# Patient Record
Sex: Female | Born: 2000 | Race: Black or African American | Hispanic: No | Marital: Single | Smoking: Never smoker
Health system: Southern US, Community
[De-identification: ages and names within clinical notes are randomized; demographics above are authoritative.]

## PROBLEM LIST (undated history)

## (undated) DIAGNOSIS — J45909 Unspecified asthma, uncomplicated: Secondary | ICD-10-CM

## (undated) HISTORY — PX: FINGER SURGERY: SHX640

## (undated) HISTORY — PX: NO PAST SURGERIES: SHX2092

---
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
## 2018-01-08 ENCOUNTER — Other Ambulatory Visit: Payer: Self-pay

## 2018-01-08 ENCOUNTER — Encounter (HOSPITAL_COMMUNITY): Payer: Self-pay | Admitting: Emergency Medicine

## 2018-01-08 ENCOUNTER — Emergency Department (HOSPITAL_COMMUNITY)
Admission: EM | Admit: 2018-01-08 | Discharge: 2018-01-08 | Disposition: A | Discharge status: Home / Self Care | Payer: Medicaid Other | Attending: Emergency Medicine | Admitting: Emergency Medicine

## 2018-01-08 ENCOUNTER — Emergency Department (HOSPITAL_COMMUNITY): Payer: Medicaid Other

## 2018-01-08 DIAGNOSIS — R079 Chest pain, unspecified: Secondary | ICD-10-CM

## 2018-01-08 DIAGNOSIS — R0789 Other chest pain: Secondary | ICD-10-CM | POA: Diagnosis not present

## 2018-01-08 LAB — CBC
HEMATOCRIT: 38.9 % (ref 36.0–46.0)
HEMOGLOBIN: 12 g/dL (ref 12.0–15.0)
MCH: 28 pg (ref 26.0–34.0)
MCHC: 30.8 g/dL (ref 30.0–36.0)
MCV: 90.9 fL (ref 80.0–100.0)
Platelets: 189 10*3/uL (ref 150–400)
RBC: 4.28 MIL/uL (ref 3.87–5.11)
RDW: 11 % — ABNORMAL LOW (ref 11.5–15.5)
WBC: 8 10*3/uL (ref 4.0–10.5)
nRBC: 0 % (ref 0.0–0.2)

## 2018-01-08 LAB — I-STAT BETA HCG BLOOD, ED (MC, WL, AP ONLY): I-stat hCG, quantitative: <5 m[IU]/mL (ref <5)

## 2018-01-08 LAB — BASIC METABOLIC PANEL
Anion gap: 7 (ref 5–15)
BUN: 15 mg/dL (ref 6–20)
CHLORIDE: 110 mmol/L (ref 98–111)
CO2: 21 mmol/L — ABNORMAL LOW (ref 22–32)
Calcium: 9 mg/dL (ref 8.9–10.3)
Creatinine, Ser: 0.7 mg/dL (ref 0.44–1.00)
GFR calc Af Amer: >60 mL/min (ref >60)
GFR calc non Af Amer: >60 mL/min (ref >60)
GLUCOSE: 113 mg/dL — AB (ref 70–99)
POTASSIUM: 3.3 mmol/L — AB (ref 3.5–5.1)
SODIUM: 138 mmol/L (ref 135–145)

## 2018-01-08 LAB — I-STAT TROPONIN, ED: Troponin i, poc: 0 ng/mL (ref 0.00–0.08)

## 2018-01-08 LAB — D-DIMER, QUANTITATIVE (NOT AT ARMC)

## 2018-01-08 LAB — I-STAT CG4 LACTIC ACID, ED: LACTIC ACID, VENOUS: 1.3 mmol/L (ref 0.5–1.9)

## 2018-01-08 NOTE — ED Notes (Signed)
PT states understanding of care given, follow up care. PT ambulated from ED to car with a steady gait.  

## 2018-01-08 NOTE — ED Notes (Signed)
Patient transported to X-ray 

## 2018-01-08 NOTE — ED Triage Notes (Signed)
Pt c/o left sided chest pain and right arm numbness while lying in the bed tonight at 2200. Numbness has resolved. Denies shortness of breath/nausea/vomiting.

## 2018-01-08 NOTE — ED Provider Notes (Signed)
[facility] EMERGENCY DEPARTMENT Provider Note   CSN: [id number] Arrival date & time: 01/08/18  0114     History   Chief Complaint Chief Complaint  Patient presents with  . Chest Pain    HPI Michaela Gibson is a 17 y.o. female.  Patient presents to the emergency department with a chief complaint of left-sided chest pain.  She states that she has had the pain all day.  She states that she has had some associated shortness of breath, and reportedly stopped breathing for 1 minute, and then spontaneously began breathing again.  She did not lose consciousness.  She complains of having some numbness in her left arm.  She states that she is under a lot of stress.  She denies any heart or lung problems.  Denies any history of PE or DVT.  Denies any recent surgery or immobilization.  She does take birth control.  She denies any leg or calf pain.  Denies any radiating pain to her back.  Denies any other associated symptoms.  She does not smoke or use any recreational drugs.  The history is provided by the patient. No language interpreter was used.    History reviewed. No pertinent past medical history.  There are no active problems to display for this patient.   History reviewed. No pertinent surgical history.   OB History   None      Home Medications    Prior to Admission medications   Not on File    Family History No family history on file.  Social History Social History   Tobacco Use  . Smoking status: Never Smoker  . Smokeless tobacco: Never Used  Substance Use Topics  . Alcohol use: No  . Drug use: Not on file     Allergies   Patient has no known allergies.   Review of Systems Review of Systems  All other systems reviewed and are negative.    Physical Exam Updated Vital Signs BP 124/77   Pulse 66   Temp 99.1 F (37.3 C) (Oral)   Resp 12   SpO2 100%   Physical Exam  Constitutional: She is oriented to person, place, and time. She  appears well-developed and well-nourished.  HENT:  Head: Normocephalic and atraumatic.  Eyes: Pupils are equal, round, and reactive to light. Conjunctivae and EOM are normal.  Neck: Normal range of motion. Neck supple.  Cardiovascular: Normal rate and regular rhythm. Exam reveals no gallop and no friction rub.  No murmur heard. Pulmonary/Chest: Effort normal and breath sounds normal. No respiratory distress. She has no wheezes. She has no rales. She exhibits no tenderness.  Abdominal: Soft. Bowel sounds are normal. She exhibits no distension and no mass. There is no tenderness. There is no rebound and no guarding.  Musculoskeletal: Normal range of motion. She exhibits no edema or tenderness.  Neurological: She is alert and oriented to person, place, and time.  Skin: Skin is warm and dry.  Psychiatric: She has a normal mood and affect. Her behavior is normal. Judgment and thought content normal.  Nursing note and vitals reviewed.    ED Treatments / Results  Labs (all labs ordered are listed, but only abnormal results are displayed) Labs Reviewed  BASIC METABOLIC PANEL - Abnormal; Notable for the following components:      Result Value   Potassium 3.3 (*)    CO2 21 (*)    Glucose, Bld 113 (*)    All other components within normal limits  CBC - Abnormal; Notable for the following components:   RDW 11.0 (*)    All other components within normal limits  D-DIMER, QUANTITATIVE (NOT AT [facility])  I-STAT TROPONIN, ED  I-STAT BETA HCG BLOOD, ED ([facility], [facility], [facility] ONLY)  I-STAT CG4 LACTIC ACID, ED    EKG EKG Interpretation  Date/Time:  Wednesday January 08 2018 01:17:52 EDT Ventricular Rate:  77 PR Interval:  116 QRS Duration: 74 QT Interval:  352 QTC Calculation: 398 R Axis:   62 Text Interpretation:  Normal sinus rhythm with sinus arrhythmia Normal ECG No previous ECGs available Confirmed by Glynn Octave [phone]) on 01/08/2018 3:13:47 AM   Radiology Dg Chest 2 View  Result Date:  01/08/2018 CLINICAL DATA:  Left side chest pain, dizziness EXAM: CHEST - 2 VIEW COMPARISON:  None. FINDINGS: The heart size and mediastinal contours are within normal limits. Both lungs are clear. The visualized skeletal structures are unremarkable. IMPRESSION: No active cardiopulmonary disease. Electronically Signed   By: Charlett Nose M.D.   On: 01/08/2018 02:09    Procedures Procedures (including critical care time)  Medications Ordered in ED Medications - No data to display   Initial Impression / Assessment and Plan / ED Course  I have reviewed the triage vital signs and the nursing notes.  Pertinent labs & imaging results that were available during my care of the patient were reviewed by me and considered in my medical decision making (see chart for details).     Patient presents with chest pain since yesterday.   DDx includes ACS, PE, pneumothorax, aortic dissection, esophageal rupture, pericarditis, chest wall pain.  Doubt ACS, normal troponin, no ischemic EKG findings, HEART score is: 0.  Low risk for PE,d-dimer is negative, patient is not tachycardic nor hypoxic.  No evidence of pneumothorax on CXR.  Doubt dissection, no mediastinal widening on CXR, no ripping/tearing chest pain, neurovascularly intact.  Doubt pericarditis, no positional changes, or diffuse ST elevations on EKG.  Pain is not reproducible, doubt MSK.  Suspect anxiety/stress, but will have patient follow-up with pcp to further address.  No additional emergent workup today.    All findings were discussed with patient.  Patient understands and agrees with the plan.     Final Clinical Impressions(s) / ED Diagnoses   Final diagnoses:  Chest pain, unspecified type    ED Discharge Orders    None       Roxy Horseman, PA-C 01/08/18 0320    Glynn Octave, MD 01/08/18 [phone]

## 2018-01-20 ENCOUNTER — Emergency Department
Admission: EM | Admit: 2018-01-20 | Discharge: 2018-01-20 | Disposition: A | Discharge status: Home / Self Care | Payer: Medicaid Other | Source: Home / Self Care | Attending: Emergency Medicine | Admitting: Emergency Medicine

## 2018-01-20 ENCOUNTER — Other Ambulatory Visit: Payer: Self-pay

## 2018-01-20 ENCOUNTER — Emergency Department
Admission: EM | Admit: 2018-01-20 | Discharge: 2018-01-20 | Disposition: A | Discharge status: Home / Self Care | Payer: Medicaid Other | Attending: Emergency Medicine | Admitting: Emergency Medicine

## 2018-01-20 ENCOUNTER — Encounter: Payer: Self-pay | Admitting: Emergency Medicine

## 2018-01-20 DIAGNOSIS — H5789 Other specified disorders of eye and adnexa: Secondary | ICD-10-CM | POA: Diagnosis present

## 2018-01-20 DIAGNOSIS — H1031 Unspecified acute conjunctivitis, right eye: Secondary | ICD-10-CM

## 2018-01-20 DIAGNOSIS — Z5321 Procedure and treatment not carried out due to patient leaving prior to being seen by health care provider: Secondary | ICD-10-CM | POA: Insufficient documentation

## 2018-01-20 MED ORDER — TOBRAMYCIN 0.3 % OP SOLN: 2.0000 [drp] | OPHTHALMIC | 0 refills | Status: DC

## 2018-01-20 NOTE — ED Provider Notes (Signed)
[facility] Emergency Department Provider Note  ____________________________________________   First MD Initiated Contact with Patient 01/20/18 1024     (approximate)  I have reviewed the triage vital signs and the nursing notes.   HISTORY  Chief Complaint Eye Problem    HPI Michaela Gibson is a 17 y.o. female presents emergency department complaining of right eye redness with some matting.  Symptoms started last night.  She denies fever chills.  No change in her vision.  No known injury.  Her younger sister has pinkeye at this time.    History reviewed. No pertinent past medical history.  There are no active problems to display for this patient.   History reviewed. No pertinent surgical history.  Prior to Admission medications   Medication Sig Start Date End Date Taking? Authorizing Provider  medroxyPROGESTERone (DEPO-PROVERA) 150 MG/ML injection Inject 150 mg into the muscle every 3 (three) months.    [provider]  tobramycin (TOBREX) 0.3 % ophthalmic solution Place 2 drops into both eyes every 4 (four) hours. 01/20/18   Faythe Ghee, PA-C    Allergies Patient has no known allergies.  History reviewed. No pertinent family history.  Social History Social History   Tobacco Use  . Smoking status: Never Smoker  . Smokeless tobacco: Never Used  Substance Use Topics  . Alcohol use: No  . Drug use: Not on file    Review of Systems  Constitutional: No fever/chills Eyes: No visual changes.  Positive for eye drainage and redness ENT: No sore throat. Respiratory: Denies cough Genitourinary: Negative for dysuria. Musculoskeletal: Negative for back pain. Skin: Negative for rash.    ____________________________________________   PHYSICAL EXAM:  VITAL SIGNS: ED Triage Vitals  Enc Vitals Group     BP 01/20/18 0918 (!) 133/96     Pulse Rate 01/20/18 0918 90     Resp 01/20/18 0918 18     Temp 01/20/18 0918 98.2 F (36.8 C)       Temp Source 01/20/18 0918 Oral     SpO2 01/20/18 0918 100 %     Weight 01/20/18 0927 98 lb (44.5 kg)     Height 01/20/18 0927 5\' 2"  (1.575 m)     Head Circumference --      Peak Flow --      Pain Score 01/20/18 0927 4     Pain Loc --      Pain Edu? --      Excl. in GC? --     Constitutional: Alert and oriented. Well appearing and in no acute distress. Eyes: Conjunctiva of the right eye is injected with some exudate Head: Atraumatic. Nose: No congestion/rhinnorhea. Mouth/Throat: Mucous membranes are moist.   Neck:  supple no lymphadenopathy noted Cardiovascular: Normal rate, regular rhythm. Heart sounds are normal Respiratory: Normal respiratory effort.  No retractions, lungs c t a  GU: deferred Musculoskeletal: FROM all extremities, warm and well perfused Neurologic:  Normal speech and language.  Skin:  Skin is warm, dry and intact. No rash noted. Psychiatric: Mood and affect are normal. Speech and behavior are normal.  ____________________________________________   LABS (all labs ordered are listed, but only abnormal results are displayed)  Labs Reviewed - No data to display ____________________________________________   ____________________________________________  RADIOLOGY    ____________________________________________   PROCEDURES  Procedure(s) performed: No  Procedures    ____________________________________________   INITIAL IMPRESSION / ASSESSMENT AND PLAN / ED COURSE  Pertinent labs & imaging results that were available during  my care of the patient were reviewed by me and considered in my medical decision making (see chart for details).   Patient is a 17 year old female presents emergency department complaining of right eye redness and drainage.  No injury.  Family member has pinkeye.  On physical exam the right eye is injected.  Remainder the exam is unremarkable  Explained symptoms to the patient.  She was given prescription for  tobramycin ophthalmic drops.  She is to follow-up with [facility] if no improvement in 2 to 3 days.  Return emergency department worsening.  See her regular doctor as needed.  States she understands will comply.  She is given a work note as she is contagious.  They were given instructions on how to care and decrease pain medication the transmission to other family members.  They state they understand and will comply.  She was discharged stable condition.     As part of my medical decision making, I reviewed the following data within the electronic MEDICAL RECORD NUMBER Nursing notes reviewed and incorporated, Notes from prior ED visits and [place] Controlled Sub[place] Database  ____________________________________________   FINAL CLINICAL IMPRESSION(S) / ED DIAGNOSES  Final diagnoses:  Acute bacterial conjunctivitis of right eye      NEW MEDICATIONS STARTED DURING THIS VISIT:  Discharge Medication List as of 01/20/2018 10:40 AM    START taking these medications   Details  tobramycin (TOBREX) 0.3 % ophthalmic solution Place 2 drops into both eyes every 4 (four) hours., Starting Mon 01/20/2018, Normal         Note:  This document was prepared using Dragon voice recognition software and may include unintentional dictation errors.    Faythe Ghee, PA-C 01/20/18 1048    Sharman Cheek, MD 01/20/18 [phone]

## 2018-01-20 NOTE — Discharge Instructions (Addendum)
Follow-up with your regular doctor if not better in 3 to 5 days.  Return emergency department worsening.  Call the [facility] if no improvement in 2 days.  Use medications as prescribed.  Apply warm compress to the right eye if it is matted shut.  You should not work until you have had 24 hours of eyedrops and there is no longer drainage from the eye.

## 2018-01-20 NOTE — ED Triage Notes (Signed)
Pt arrived via POV with family with reports of redness to right eye that started last night. Pt's daughter is also being seen today for similar eye problem.    Pt reports redness and increased drainage and was matted shut this morning.

## 2018-01-20 NOTE — ED Notes (Signed)
See triage note  Presents with right eye redness and irritation  Also had some drainage

## 2018-01-20 NOTE — ED Triage Notes (Signed)
Reports right eye redness since 10 pm.

## 2018-02-21 ENCOUNTER — Emergency Department: Payer: Self-pay

## 2018-02-21 ENCOUNTER — Emergency Department
Admission: EM | Admit: 2018-02-21 | Discharge: 2018-02-21 | Disposition: A | Discharge status: Home / Self Care | Payer: Self-pay | Attending: Emergency Medicine | Admitting: Emergency Medicine

## 2018-02-21 DIAGNOSIS — Z79899 Other long term (current) drug therapy: Secondary | ICD-10-CM | POA: Insufficient documentation

## 2018-02-21 DIAGNOSIS — M25562 Pain in left knee: Secondary | ICD-10-CM | POA: Insufficient documentation

## 2018-02-21 MED ORDER — NAPROXEN 500 MG PO TABS: 500.0000 mg | ORAL_TABLET | Freq: Two times a day (BID) | ORAL | Status: DC

## 2018-02-21 NOTE — ED Provider Notes (Signed)
[facility] Emergency Department Provider Note   ____________________________________________   First MD Initiated Contact with Patient 02/21/18 [phone]     (approximate)  I have reviewed the triage vital signs and the nursing notes.   HISTORY  Chief Complaint Knee Pain    HPI Michaela Gibson is a 17 y.o. female patient complaint intermittent knee pain for 4 years.  Onset and style was at a track event when she was told she had a meniscus injury.  No orthopedic intervention.  Patient states she was at work last night which required prolonged standing and when she left work she had increased pain with flexion of the knee.  Patient states pain initially was 10/10 has now decreased to a 6/10 status post limited standing and weightbearing.  Patient described the pain is "achy".  No palliative measure for complaint.  History reviewed. No pertinent past medical history.  There are no active problems to display for this patient.   History reviewed. No pertinent surgical history.  Prior to Admission medications   Medication Sig Start Date End Date Taking? Authorizing Provider  medroxyPROGESTERone (DEPO-PROVERA) 150 MG/ML injection Inject 150 mg into the muscle every 3 (three) months.    [provider]  naproxen (NAPROSYN) 500 MG tablet Take 1 tablet (500 mg total) by mouth 2 (two) times daily with a meal. 02/21/18   Joni Reining, PA-C  tobramycin (TOBREX) 0.3 % ophthalmic solution Place 2 drops into both eyes every 4 (four) hours. 01/20/18   Faythe Ghee, PA-C    Allergies Patient has no known allergies.  No family history on file.  Social History Social History   Tobacco Use  . Smoking status: Never Smoker  . Smokeless tobacco: Never Used  Substance Use Topics  . Alcohol use: No  . Drug use: Not on file    Review of Systems Constitutional: No fever/chills Eyes: No visual changes. ENT: No sore throat. Cardiovascular: Denies chest  pain. Respiratory: Denies shortness of breath. Gastrointestinal: No abdominal pain.  No nausea, no vomiting.  No diarrhea.  No constipation. Genitourinary: Negative for dysuria. Musculoskeletal: Positive for left knee pain.. Skin: Negative for rash. Neurological: Negative for headaches, focal weakness or numbness.   ____________________________________________   PHYSICAL EXAM:  VITAL SIGNS: ED Triage Vitals [02/21/18 0721]  Enc Vitals Group     BP      Pulse      Resp      Temp      Temp src      SpO2      Weight      Height      Head Circumference      Peak Flow      Pain Score 6     Pain Loc      Pain Edu?      Excl. in GC?     Constitutional: Alert and oriented. Well appearing and in no acute distress. Cardiovascular: Normal rate, regular rhythm. Grossly normal heart sounds.  Good peripheral circulation. Respiratory: Normal respiratory effort.  No retractions. Lungs CTAB. Musculoskeletal: No lower extremity tenderness nor edema.  No joint effusions. Neurologic:  Normal speech and language. No gross focal neurologic deficits are appreciated. No gait instability. Skin:  Skin is warm, dry and intact. No rash noted. Psychiatric: Mood and affect are normal. Speech and behavior are normal.  ____________________________________________   LABS (all labs ordered are listed, but only abnormal results are displayed)  Labs Reviewed - No data to display  ____________________________________________  EKG   ____________________________________________  RADIOLOGY  ED MD interpretation: Official radiology report(s): Dg Knee Complete 4 Views Left  Result Date: 02/21/2018 CLINICAL DATA:  Increased knee pain with flexion. EXAM: LEFT KNEE - COMPLETE 4+ VIEW COMPARISON:  No prior. FINDINGS: No acute bony or joint abnormality identified. No evidence of fracture or dislocation. IMPRESSION: No acute bony or joint abnormality identified. Electronically Signed   By: Maisie Fushomas   Register   On: 02/21/2018 08:10    ____________________________________________   PROCEDURES  Procedure(s) performed: None  Procedures  Critical Care performed: No  ____________________________________________   INITIAL IMPRESSION / ASSESSMENT AND PLAN / ED COURSE  As part of my medical decision making, I reviewed the following data within the electronic MEDICAL RECORD NUMBER    Chronic right knee pain.  Discussed negative findings with patient.  Patient advised to follow orthopedic for definitive evaluation treatment.      ____________________________________________   FINAL CLINICAL IMPRESSION(S) / ED DIAGNOSES  Final diagnoses:  Left knee pain, unspecified chronicity     ED Discharge Orders         Ordered    naproxen (NAPROSYN) 500 MG tablet  2 times daily with meals     02/21/18 [id number]           Note:  This document was prepared using Dragon voice recognition software and may include unintentional dictation errors.    Joni ReiningSmith, Ninetta Adelstein K, PA-C 02/21/18 Isac [id number]    Jene EveryKinner, Robert, MD 02/23/18 2224

## 2018-02-21 NOTE — ED Triage Notes (Signed)
Pt presents today for knee pain. Pt states she hurt it 4 years ago but the pain has been coming and going. Pt is ambulatory in triage.

## 2018-02-21 NOTE — ED Notes (Signed)
Patient left without prescription or written discharge instructions. Patient did receive her work note. Two unsuccessful attempts to contact via telephone. Will defer to Viviano SimasLiz Gannon RN to follow-up.

## 2018-02-21 NOTE — Discharge Instructions (Addendum)
Advised to wear over-the-counter elastic knee support when working.

## 2018-03-28 ENCOUNTER — Encounter: Payer: Self-pay | Admitting: Emergency Medicine

## 2018-03-28 ENCOUNTER — Emergency Department
Admission: EM | Admit: 2018-03-28 | Discharge: 2018-03-28 | Disposition: A | Discharge status: Home / Self Care | Payer: Medicaid Other | Attending: Emergency Medicine | Admitting: Emergency Medicine

## 2018-03-28 ENCOUNTER — Other Ambulatory Visit: Payer: Self-pay

## 2018-03-28 DIAGNOSIS — J45909 Unspecified asthma, uncomplicated: Secondary | ICD-10-CM | POA: Insufficient documentation

## 2018-03-28 DIAGNOSIS — R8271 Bacteriuria: Secondary | ICD-10-CM | POA: Insufficient documentation

## 2018-03-28 DIAGNOSIS — R1033 Periumbilical pain: Secondary | ICD-10-CM | POA: Insufficient documentation

## 2018-03-28 DIAGNOSIS — R110 Nausea: Secondary | ICD-10-CM

## 2018-03-28 DIAGNOSIS — R112 Nausea with vomiting, unspecified: Secondary | ICD-10-CM | POA: Insufficient documentation

## 2018-03-28 HISTORY — DX: Unspecified asthma, uncomplicated: J45.909

## 2018-03-28 LAB — COMPREHENSIVE METABOLIC PANEL
ALK PHOS: 69 U/L (ref 38–126)
ALT: 13 U/L (ref 0–44)
AST: 22 U/L (ref 15–41)
Albumin: 4.5 g/dL (ref 3.5–5.0)
Anion gap: 8 (ref 5–15)
BUN: 16 mg/dL (ref 6–20)
CALCIUM: 8.9 mg/dL (ref 8.9–10.3)
CO2: 23 mmol/L (ref 22–32)
CREATININE: 0.65 mg/dL (ref 0.44–1.00)
Chloride: 105 mmol/L (ref 98–111)
Glucose, Bld: 98 mg/dL (ref 70–99)
Potassium: 3.3 mmol/L — ABNORMAL LOW (ref 3.5–5.1)
Sodium: 136 mmol/L (ref 135–145)
TOTAL PROTEIN: 7.3 g/dL (ref 6.5–8.1)
Total Bilirubin: 1.5 mg/dL — ABNORMAL HIGH (ref 0.3–1.2)

## 2018-03-28 LAB — CBC
HCT: 37.3 % (ref 36.0–46.0)
Hemoglobin: 12.1 g/dL (ref 12.0–15.0)
MCH: 28.5 pg (ref 26.0–34.0)
MCHC: 32.4 g/dL (ref 30.0–36.0)
MCV: 88 fL (ref 80.0–100.0)
NRBC: 0 % (ref 0.0–0.2)
PLATELETS: 158 10*3/uL (ref 150–400)
RBC: 4.24 MIL/uL (ref 3.87–5.11)
RDW: 11.3 % — AB (ref 11.5–15.5)
WBC: 7.1 10*3/uL (ref 4.0–10.5)

## 2018-03-28 LAB — URINALYSIS, COMPLETE (UACMP) WITH MICROSCOPIC
Bilirubin Urine: NEGATIVE
Glucose, UA: NEGATIVE mg/dL
Hgb urine dipstick: NEGATIVE
Ketones, ur: NEGATIVE mg/dL
Leukocytes, UA: NEGATIVE
Nitrite: NEGATIVE
Protein, ur: NEGATIVE mg/dL
Specific Gravity, Urine: 1.02 (ref 1.005–1.030)
pH: 6 (ref 5.0–8.0)

## 2018-03-28 LAB — POCT PREGNANCY, URINE: Preg Test, Ur: NEGATIVE

## 2018-03-28 LAB — LIPASE, BLOOD: Lipase: 38 U/L (ref 11–51)

## 2018-03-28 MED ORDER — ONDANSETRON HCL 4 MG/2ML IJ SOLN: 4.0000 mg | Freq: Once | INTRAMUSCULAR | Status: DC

## 2018-03-28 MED ORDER — SULFAMETHOXAZOLE-TRIMETHOPRIM 800-160 MG PO TABS: 1.0000 | ORAL_TABLET | Freq: Two times a day (BID) | ORAL | 0 refills | Status: AC

## 2018-03-28 MED ORDER — ONDANSETRON 4 MG PO TBDP
4.0000 mg | ORAL_TABLET | Freq: Once | ORAL | Status: AC
  Administered 2018-03-28: 4 mg via ORAL

## 2018-03-28 MED ORDER — ONDANSETRON 4 MG PO TBDP
ORAL_TABLET | ORAL | Status: AC
  Administered 2018-03-28: 4 mg via ORAL
  Filled 2018-03-28: qty 1

## 2018-03-28 MED ORDER — ONDANSETRON 4 MG PO TBDP: 4.0000 mg | ORAL_TABLET | Freq: Three times a day (TID) | ORAL | 0 refills | Status: DC | PRN

## 2018-03-28 MED ORDER — ACETAMINOPHEN 325 MG PO TABS
650.0000 mg | ORAL_TABLET | Freq: Once | ORAL | Status: AC
  Administered 2018-03-28: 650 mg via ORAL
  Filled 2018-03-28 (×2): qty 2

## 2018-03-28 MED ORDER — SULFAMETHOXAZOLE-TRIMETHOPRIM 800-160 MG PO TABS: 1.0000 | ORAL_TABLET | Freq: Two times a day (BID) | ORAL | 0 refills | Status: DC

## 2018-03-28 NOTE — ED Provider Notes (Signed)
[facility] Emergency Department Provider Note  ____________________________________________  Time seen: Approximately 7:51 PM  I have reviewed the triage vital signs and the nursing notes.   HISTORY  Chief Complaint Abdominal Pain    HPI Michaela Gibson is a 18 y.o. female, nonpregnant, and otherwise healthy presenting with periumbilical pain.  The patient reports that yesterday she was at work when she noted sharp pains that would come and go just below the umbilicus.  She had associated nausea without vomiting.  The pain has been persistent since yesterday.  There is nothing she can do to make it better or worse.  She tried Motrin without significant improvement.  She is not had any associated fever, constipation or diarrhea, dysuria, or change in vaginal discharge.  She does state that she recently stopped taking Depo-Provera and is trying to get pregnant.  Past Medical History:  Diagnosis Date  . Asthma     There are no active problems to display for this patient.   History reviewed. No pertinent surgical history.  Current Outpatient Rx  . Order #: [id number] Class: Historical Med  . Order #: [id number] Class: Print  . Order #: [id number] Class: Print  . Order #: [id number] Class: Normal  . Order #: [id number] Class: Normal    Allergies Patient has no known allergies.  No family history on file.  Social History Social History   Tobacco Use  . Smoking status: Never Smoker  . Smokeless tobacco: Never Used  Substance Use Topics  . Alcohol use: No  . Drug use: Not on file    Review of Systems Constitutional: No fever/chills.  No lightheadedness or syncope. Eyes: No visual changes. ENT: No sore throat. No congestion or rhinorrhea. Cardiovascular: Denies chest pain. Denies palpitations. Respiratory: Denies shortness of breath.  No cough. Gastrointestinal: Positive periumbilical abdominal pain.  Positive nausea, no vomiting.  No diarrhea.  No  constipation. Genitourinary: Negative for dysuria.  No urinary frequency.  No change in vaginal discharge. Musculoskeletal: Negative for back pain. Skin: Negative for rash. Neurological: Negative for headaches. No focal numbness, tingling or weakness.     ____________________________________________   PHYSICAL EXAM:  VITAL SIGNS: ED Triage Vitals  Enc Vitals Group     BP 03/28/18 1835 119/80     Pulse Rate 03/28/18 1835 68     Resp 03/28/18 1835 18     Temp 03/28/18 1835 98.9 F (37.2 C)     Temp Source 03/28/18 1835 Oral     SpO2 03/28/18 1835 100 %     Weight 03/28/18 1832 100 lb (45.4 kg)     Height 03/28/18 1832 5\' 1"  (1.549 m)     Head Circumference --      Peak Flow --      Pain Score 03/28/18 1832 5     Pain Loc --      Pain Edu? --      Excl. in GC? --     Constitutional: Alert and oriented.  Answers questions appropriately. Eyes: Conjunctivae are normal.  EOMI. No scleral icterus. Head: Atraumatic. Nose: No congestion/rhinnorhea. Mouth/Throat: Mucous membranes are moist.  Neck: No stridor.  Supple.   Cardiovascular: Normal rate, regular rhythm. No murmurs, rubs or gallops.  Respiratory: Normal respiratory effort.  No accessory muscle use or retractions. Lungs CTAB.  No wheezes, rales or ronchi. Gastrointestinal: Soft, and nondistended.  Normal tenderness to palpation in all aspects of the abdomen without focality.  No guarding or rebound.  No peritoneal signs. Musculoskeletal: No LE edema. Neurologic:  A&Ox3.  Speech is clear.  Face and smile are symmetric.  EOMI.  Moves all extremities well. Skin:  Skin is warm, dry and intact. No rash noted. Psychiatric: Mood and affect are normal. Speech and behavior are normal.  Normal judgement.  ____________________________________________   LABS (all labs ordered are listed, but only abnormal results are displayed)  Labs Reviewed  COMPREHENSIVE METABOLIC PANEL - Abnormal; Notable for the following components:       Result Value   Potassium 3.3 (*)    Total Bilirubin 1.5 (*)    All other components within normal limits  CBC - Abnormal; Notable for the following components:   RDW 11.3 (*)    All other components within normal limits  URINALYSIS, COMPLETE (UACMP) WITH MICROSCOPIC - Abnormal; Notable for the following components:   Color, Urine YELLOW (*)    APPearance CLEAR (*)    Bacteria, UA RARE (*)    All other components within normal limits  LIPASE, BLOOD  POCT PREGNANCY, URINE  POC URINE PREG, ED   ____________________________________________  EKG  Not indicated ____________________________________________  RADIOLOGY  No results found.  ____________________________________________   PROCEDURES  Procedure(s) performed: None  Procedures  Critical Care performed: No ____________________________________________   INITIAL IMPRESSION / ASSESSMENT AND PLAN / ED COURSE  Pertinent labs & imaging results that were available during my care of the patient were reviewed by me and considered in my medical decision making (see chart for details).  18 y.o. female, otherwise healthy, presenting with intermittent episodes of sharp pain just below the umbilicus, nausea but no vomiting, for the past 2 days.  Overall, the patient is hemodynamically stable and afebrile.  She has no focal abdominal pain on my exam.  The patient's laboratory studies are reassuring with normal lipase, reassuring electrolytes, and a normal white blood cell count.  She is not anemic.  Her urinalysis shows bacteriuria but no other signs of infection; given that she is having lower abdominal pain, we will treat her with 3-day course of antibiotics to see if this helps.  I have given her precautions about early appendicitis and she understands what symptoms to return for.  Other pathology including ovarian pathology is considered but less likely.  At this time, the patient is safe for discharge home.  Return precautions as  well as follow-up instructions were discussed  ____________________________________________  FINAL CLINICAL IMPRESSION(S) / ED DIAGNOSES  Final diagnoses:  Periumbilical pain  Nausea without vomiting  Bacteriuria         NEW MEDICATIONS STARTED DURING THIS VISIT:  New Prescriptions   ONDANSETRON (ZOFRAN ODT) 4 MG DISINTEGRATING TABLET    Take 1 tablet (4 mg total) by mouth every 8 (eight) hours as needed for nausea or vomiting.   SULFAMETHOXAZOLE-TRIMETHOPRIM (BACTRIM DS,SEPTRA DS) 800-160 MG TABLET    Take 1 tablet by mouth 2 (two) times daily for 3 days.      Rockne Menghini, MD 03/28/18 2000

## 2018-03-28 NOTE — ED Triage Notes (Signed)
First Nurse Note:  Arrives with C/O stabbing pains to abdomen x 3 days.  Denies emesis and diarrhea.  Complains only of nausea.

## 2018-03-28 NOTE — Discharge Instructions (Addendum)
You may take Tylenol or Motrin for your pain.  Zofran is for nausea and vomiting.  Please take a clear liquid diet for the next 24 hours, then advance to a bland diet as tolerated.  Occasionally, the type of pain you are describing today can be due to early appendicitis.  Most likely, your pain will go away but if it does not, or if it gets worse, goes to the right lower part of your abdomen, or you develop vomiting or fever with your pain, please return immediately to the emergency department.

## 2018-03-28 NOTE — ED Triage Notes (Signed)
Pt arrived via POV with reports of sharp abdominal pain that is intermittent x 3 days. Pt states the pain lasts for about 10 minutes then goes away.  Pt reports some nausea with the pain, denies vomiting or diarrhea. Pt denies any dysuria, vaginal bleeding or vaginal discharge.   Pt states she recently stopped receiving Depo shots as well. Was due for shot in December, but states she is not going to keep getting them.

## 2018-04-01 ENCOUNTER — Emergency Department
Admission: EM | Admit: 2018-04-01 | Discharge: 2018-04-01 | Disposition: A | Discharge status: Home / Self Care | Payer: Medicaid Other | Attending: Emergency Medicine | Admitting: Emergency Medicine

## 2018-04-01 ENCOUNTER — Other Ambulatory Visit: Payer: Self-pay

## 2018-04-01 DIAGNOSIS — R519 Headache, unspecified: Secondary | ICD-10-CM

## 2018-04-01 DIAGNOSIS — Z79899 Other long term (current) drug therapy: Secondary | ICD-10-CM | POA: Insufficient documentation

## 2018-04-01 DIAGNOSIS — J45909 Unspecified asthma, uncomplicated: Secondary | ICD-10-CM | POA: Insufficient documentation

## 2018-04-01 DIAGNOSIS — R1030 Lower abdominal pain, unspecified: Secondary | ICD-10-CM

## 2018-04-01 DIAGNOSIS — R51 Headache: Secondary | ICD-10-CM | POA: Insufficient documentation

## 2018-04-01 LAB — URINALYSIS, COMPLETE (UACMP) WITH MICROSCOPIC
Bacteria, UA: NONE SEEN
Bilirubin Urine: NEGATIVE
Glucose, UA: NEGATIVE mg/dL
Hgb urine dipstick: NEGATIVE
Ketones, ur: NEGATIVE mg/dL
Leukocytes, UA: NEGATIVE
Nitrite: NEGATIVE
Protein, ur: NEGATIVE mg/dL
SPECIFIC GRAVITY, URINE: 1.015 (ref 1.005–1.030)
WBC, UA: NONE SEEN WBC/hpf (ref 0–5)
pH: 8 (ref 5.0–8.0)

## 2018-04-01 LAB — CBC
HCT: 39 % (ref 36.0–46.0)
Hemoglobin: 12.6 g/dL (ref 12.0–15.0)
MCH: 28.4 pg (ref 26.0–34.0)
MCHC: 32.3 g/dL (ref 30.0–36.0)
MCV: 87.8 fL (ref 80.0–100.0)
Platelets: 175 10*3/uL (ref 150–400)
RBC: 4.44 MIL/uL (ref 3.87–5.11)
RDW: 11.3 % — AB (ref 11.5–15.5)
WBC: 5.2 10*3/uL (ref 4.0–10.5)
nRBC: 0 % (ref 0.0–0.2)

## 2018-04-01 LAB — COMPREHENSIVE METABOLIC PANEL
ALT: 20 U/L (ref 0–44)
AST: 29 U/L (ref 15–41)
Albumin: 4.4 g/dL (ref 3.5–5.0)
Alkaline Phosphatase: 70 U/L (ref 38–126)
Anion gap: 8 (ref 5–15)
BUN: 11 mg/dL (ref 6–20)
CO2: 24 mmol/L (ref 22–32)
CREATININE: 0.88 mg/dL (ref 0.44–1.00)
Calcium: 9 mg/dL (ref 8.9–10.3)
Chloride: 105 mmol/L (ref 98–111)
GFR calc Af Amer: >60 mL/min (ref >60)
GFR calc non Af Amer: >60 mL/min (ref >60)
Glucose, Bld: 99 mg/dL (ref 70–99)
Potassium: 3.6 mmol/L (ref 3.5–5.1)
Sodium: 137 mmol/L (ref 135–145)
Total Bilirubin: 2 mg/dL — ABNORMAL HIGH (ref 0.3–1.2)
Total Protein: 7.3 g/dL (ref 6.5–8.1)

## 2018-04-01 LAB — LIPASE, BLOOD: Lipase: 43 U/L (ref 11–51)

## 2018-04-01 LAB — POCT PREGNANCY, URINE: Preg Test, Ur: NEGATIVE

## 2018-04-01 MED ORDER — IBUPROFEN 400 MG PO TABS
400.0000 mg | ORAL_TABLET | Freq: Once | ORAL | Status: AC
  Administered 2018-04-01: 400 mg via ORAL
  Filled 2018-04-01: qty 1

## 2018-04-01 NOTE — ED Notes (Signed)
Pt in bed, Dr Cyril Loosen at bedside, no distress noted.

## 2018-04-01 NOTE — ED Provider Notes (Signed)
[facility] Emergency Department Provider Note   ____________________________________________    I have reviewed the triage vital signs and the nursing notes.   HISTORY  Chief Complaint Abdominal Pain     HPI Michaela Gibson is a 18 y.o. female who presents with complaints of abdominal cramping, now resolved as well as a left-sided headache.  Patient reports she was seen here several days ago for intermittent abdominal cramping, she had been doing better but was told that if she had a repeat of cramping to come back to the emergency department for reevaluation.  This morning she had a brief bout of bilateral lower abdominal cramping, not pelvic.  Now she feels well although she reports she has a left-sided headache, no neuro deficits.  No history of migraines.  No neck pain or fever.  Has not take anything for this headache   Past Medical History:  Diagnosis Date  . Asthma     There are no active problems to display for this patient.   History reviewed. No pertinent surgical history.  Prior to Admission medications   Medication Sig Start Date End Date Taking? Authorizing Provider  medroxyPROGESTERone (DEPO-PROVERA) 150 MG/ML injection Inject 150 mg into the muscle every 3 (three) months.    [provider]  naproxen (NAPROSYN) 500 MG tablet Take 1 tablet (500 mg total) by mouth 2 (two) times daily with a meal. 02/21/18   Joni ReiningSmith, Ronald K, PA-C  ondansetron (ZOFRAN ODT) 4 MG disintegrating tablet Take 1 tablet (4 mg total) by mouth every 8 (eight) hours as needed for nausea or vomiting. 03/28/18   Rockne MenghiniNorman, Anne-Caroline, MD  tobramycin (TOBREX) 0.3 % ophthalmic solution Place 2 drops into both eyes every 4 (four) hours. 01/20/18   Faythe GheeFisher, Susan W, PA-C     Allergies Patient has no known allergies.  History reviewed. No pertinent family history.  Social History Social History   Tobacco Use  . Smoking status: Never Smoker  . Smokeless  tobacco: Never Used  Substance Use Topics  . Alcohol use: No  . Drug use: Not on file    Review of Systems  Constitutional: No fever/chills Eyes: No visual changes.  ENT: No sore throat. Cardiovascular: Denies chest pain. Respiratory: Denies shortness of breath. Gastrointestinal: As above Genitourinary: Negative for dysuria.  No hematuria Musculoskeletal: Negative for back pain. Skin: Negative for rash. Neurological: As above   ____________________________________________   PHYSICAL EXAM:  VITAL SIGNS: ED Triage Vitals  Enc Vitals Group     BP 04/01/18 1219 112/72     Pulse Rate 04/01/18 1219 92     Resp 04/01/18 1219 16     Temp 04/01/18 1219 98.5 F (36.9 C)     Temp Source 04/01/18 1219 Oral     SpO2 04/01/18 1219 100 %     Weight 04/01/18 1219 45.4 kg (100 lb)     Height 04/01/18 1219 1.549 m (5\' 1" )     Head Circumference --      Peak Flow --      Pain Score 04/01/18 1220 8     Pain Loc --      Pain Edu? --      Excl. in GC? --     Constitutional: Alert and oriented. No acute distress.  Eyes: Conjunctivae are normal.  PERRLA, EOMI Head: Atraumatic. Nose: No congestion/rhinnorhea. Mouth/Throat: Mucous membranes are moist.   Neck:  Painless ROM Cardiovascular: Normal rate, regular rhythm.  Good peripheral circulation. Respiratory: Normal respiratory  effort.  No retractions. . Gastrointestinal: Soft and nontender. No distention.  No CVA tenderness.  Negative tenderness over McBurney's point   Neurologic:  Normal speech and language. No gross focal neurologic deficits are appreciated.  Normal strength in all extremities, cranial nerves normal Skin:  Skin is warm, dry and intact. No rash noted. Psychiatric: Mood and affect are normal. Speech and behavior are normal.  ____________________________________________   LABS (all labs ordered are listed, but only abnormal results are displayed)  Labs Reviewed  COMPREHENSIVE METABOLIC PANEL - Abnormal;  Notable for the following components:      Result Value   Total Bilirubin 2.0 (*)    All other components within normal limits  CBC - Abnormal; Notable for the following components:   RDW 11.3 (*)    All other components within normal limits  LIPASE, BLOOD  URINALYSIS, COMPLETE (UACMP) WITH MICROSCOPIC  POCT PREGNANCY, URINE  POC URINE PREG, ED   ____________________________________________  EKG  None ____________________________________________  RADIOLOGY   ____________________________________________   PROCEDURES  Procedure(s) performed: No  Procedures   Critical Care performed: No ____________________________________________   INITIAL IMPRESSION / ASSESSMENT AND PLAN / ED COURSE  Pertinent labs & imaging results that were available during my care of the patient were reviewed by me and considered in my medical decision making (see chart for details).  Patient well-appearing and in no acute distress, primary complaint appears to be headache.  No concerning signs or symptoms associated with this.  No abdominal pain currently.  Lab work is unremarkable.  Will treat headache with Motrin, recommend outpatient follow-up and supportive care    ____________________________________________   FINAL CLINICAL IMPRESSION(S) / ED DIAGNOSES  Final diagnoses:  Acute nonintractable headache, unspecified headache type  Lower abdominal pain        Note:  This document was prepared using Dragon voice recognition software and may include unintentional dictation errors.   Jene Every, MD 04/01/18 1256

## 2018-04-01 NOTE — ED Triage Notes (Addendum)
Pt seen here Friday for abd pain below belly button that moves and comes and goes. States no pain at this time but c/o HA. A&O, ambulatory. No distress noted. Denies N&V&D and fever. States only had blood and urine on Friday, no scans.

## 2018-06-15 ENCOUNTER — Other Ambulatory Visit: Payer: Self-pay

## 2018-06-15 ENCOUNTER — Ambulatory Visit (HOSPITAL_COMMUNITY)
Admission: EM | Admit: 2018-06-15 | Discharge: 2018-06-15 | Disposition: A | Discharge status: Home / Self Care | Payer: Medicaid Other | Attending: Family Medicine | Admitting: Family Medicine

## 2018-06-15 ENCOUNTER — Encounter (HOSPITAL_COMMUNITY): Payer: Self-pay

## 2018-06-15 DIAGNOSIS — R110 Nausea: Secondary | ICD-10-CM

## 2018-06-15 DIAGNOSIS — N926 Irregular menstruation, unspecified: Secondary | ICD-10-CM

## 2018-06-15 DIAGNOSIS — R350 Frequency of micturition: Secondary | ICD-10-CM

## 2018-06-15 DIAGNOSIS — Z3202 Encounter for pregnancy test, result negative: Secondary | ICD-10-CM

## 2018-06-15 DIAGNOSIS — R109 Unspecified abdominal pain: Secondary | ICD-10-CM

## 2018-06-15 LAB — POCT URINALYSIS DIP (DEVICE)
Bilirubin Urine: NEGATIVE
Glucose, UA: 100 mg/dL — AB
Ketones, ur: NEGATIVE mg/dL
Leukocytes,Ua: NEGATIVE
Nitrite: NEGATIVE
Protein, ur: NEGATIVE mg/dL
Specific Gravity, Urine: 1.025 (ref 1.005–1.030)
Urobilinogen, UA: 4 mg/dL — ABNORMAL HIGH (ref 0.0–1.0)
pH: 7 (ref 5.0–8.0)

## 2018-06-15 LAB — POCT PREGNANCY, URINE: Preg Test, Ur: NEGATIVE

## 2018-06-15 LAB — GLUCOSE, CAPILLARY: Glucose-Capillary: 91 mg/dL (ref 70–99)

## 2018-06-15 NOTE — ED Provider Notes (Signed)
[facility]   [id number] 06/15/18 Arrival Time: 1300   CC: concern for pregnancy  SUBJECTIVE:  Michaela Gibson is a 18 y.o. female who presents for pregnancy test. Two negative pregnancy tests at home. LMP unknown, has irregular periods and was on depo.  Last depo shot august 2019.  Does not use condoms.  Last unprotected sex 2 days ago.  Not actively trying to get pregnant, but not preventing.  Complains of associated "stomach cramps," breast tenderness, nausea, increased urinary frequency, food and smell aversions.  She denies fever, chills, vomiting, weight changes, fatigue, abdominal or pelvic pain, dysuria, urinary urgency, vaginal bleeding.    No LMP recorded. (Menstrual status: Irregular Periods). Current birth control method: None; last depo August 2019  ROS: As per HPI.  Past Medical History:  Diagnosis Date  . Asthma    History reviewed. No pertinent surgical history. No Known Allergies No current facility-administered medications on file prior to encounter.    No current outpatient medications on file prior to encounter.    Social History   Socioeconomic History  . Marital status: Single    Spouse name: Not on file  . Number of children: Not on file  . Years of education: Not on file  . Highest education level: Not on file  Occupational History  . Not on file  Social Needs  . Financial resource strain: Not on file  . Food insecurity:    Worry: Not on file    Inability: Not on file  . Transportation needs:    Medical: Not on file    Non-medical: Not on file  Tobacco Use  . Smoking status: Never Smoker  . Smokeless tobacco: Never Used  Substance and Sexual Activity  . Alcohol use: No  . Drug use: Not on file  . Sexual activity: Not on file  Lifestyle  . Physical activity:    Days per week: Not on file    Minutes per session: Not on file  . Stress: Not on file  Relationships  . Social connections:    Talks on phone: Not on file    Gets  together: Not on file    Attends religious service: Not on file    Active member of club or organization: Not on file    Attends meetings of clubs or organizations: Not on file    Relationship status: Not on file  . Intimate partner violence:    Fear of current or ex partner: Not on file    Emotionally abused: Not on file    Physically abused: Not on file    Forced sexual activity: Not on file  Other Topics Concern  . Not on file  Social History Narrative  . Not on file   Family History  Problem Relation Age of Onset  . Healthy Mother   . Healthy Father     OBJECTIVE:  Vitals:   06/15/18 1405 06/15/18 1407 06/15/18 1537  BP:  113/63   Pulse:  82   Resp:  18   Temp:  97.6 F (36.4 C)   SpO2:  (!) 82% 100%  Weight: 100 lb (45.4 kg)       General appearance: Alert, NAD, appears stated age Head: NCAT Throat: lips, mucosa, and tongue normal; teeth and gums normal Lungs: CTA bilaterally without adventitious breath sounds Heart: regular rate and rhythm.  Radial pulses 2+ symmetrical bilaterally Back: no CVA tenderness Abdomen: soft, non-tender; bowel sounds normal; no guarding or rebound tenderness Skin: warm and  dry Psychological:  Alert and cooperative. Normal mood and affect.  LABS:  Results for orders placed or performed during the hospital encounter of 06/15/18 (from the past 24 hour(s))  POCT urinalysis dip (device)     Status: Abnormal   Collection Time: 06/15/18  2:32 PM  Result Value Ref Range   Glucose, UA 100 (A) NEGATIVE mg/dL   Bilirubin Urine NEGATIVE NEGATIVE   Ketones, ur NEGATIVE NEGATIVE mg/dL   Specific Gravity, Urine 1.025 1.005 - 1.030   Hgb urine dipstick TRACE (A) NEGATIVE   pH 7.0 5.0 - 8.0   Protein, ur NEGATIVE NEGATIVE mg/dL   Urobilinogen, UA 4.0 (H) 0.0 - 1.0 mg/dL   Nitrite NEGATIVE NEGATIVE   Leukocytes,Ua NEGATIVE NEGATIVE  Pregnancy, urine POC     Status: None   Collection Time: 06/15/18  2:37 PM  Result Value Ref Range   Preg  Test, Ur NEGATIVE NEGATIVE  Glucose, capillary     Status: None   Collection Time: 06/15/18  2:50 PM  Result Value Ref Range   Glucose-Capillary 91 70 - 99 mg/dL    ASSESSMENT & PLAN:  1. Pregnancy test negative    Pending: Labs Reviewed  POCT URINALYSIS DIP (DEVICE) - Abnormal; Notable for the following components:      Result Value   Glucose, UA 100 (*)    Hgb urine dipstick TRACE (*)    Urobilinogen, UA 4.0 (*)    All other components within normal limits  GLUCOSE, CAPILLARY  POC URINE PREG, ED  POCT PREGNANCY, URINE  CBG MONITORING, ED   Urine pregnancy negative. Urine did show glucose and trace blood. Blood glucose within normal limits Rest and push fluids Use OTC tylenol as needed for body aches and pain Follow up with PCP or with Joaquin Courts FNP, if you do not have a PCP, for further evaluation and management Go to the ED if you have any new or worsening symptoms such as nausea, vomiting, abdominal pain, fever, chills, changes in bowel or bladder habits, etc...  Reviewed expectations re: course of current medical issues. Questions answered. Outlined signs and symptoms indicating need for more acute intervention. Patient verbalized understanding. After Visit Summary given.       Rennis Harding, PA-C 06/15/18 1540

## 2018-06-15 NOTE — Discharge Instructions (Signed)
Urine pregnancy negative. Urine did show glucose and trace blood. Blood glucose within normal limits Rest and push fluids Use OTC tylenol as needed for body aches and pain Follow up with PCP or with Michaela Courts FNP, if you do not have a PCP, for further evaluation and management Go to the ED if you have any new or worsening symptoms such as nausea, vomiting, abdominal pain, fever, chills, changes in bowel or bladder habits, etc..Marland Kitchen

## 2018-06-15 NOTE — ED Triage Notes (Signed)
Pt cc stomach pain and breast pain x 1 week.

## 2018-08-22 ENCOUNTER — Encounter (HOSPITAL_COMMUNITY): Payer: Self-pay

## 2018-08-22 ENCOUNTER — Emergency Department (HOSPITAL_COMMUNITY)
Admission: EM | Admit: 2018-08-22 | Discharge: 2018-08-22 | Disposition: A | Discharge status: Home / Self Care | Payer: Medicaid Other | Attending: Emergency Medicine | Admitting: Emergency Medicine

## 2018-08-22 ENCOUNTER — Emergency Department (HOSPITAL_COMMUNITY): Payer: Medicaid Other

## 2018-08-22 ENCOUNTER — Other Ambulatory Visit: Payer: Self-pay

## 2018-08-22 DIAGNOSIS — Y999 Unspecified external cause status: Secondary | ICD-10-CM | POA: Insufficient documentation

## 2018-08-22 DIAGNOSIS — Y929 Unspecified place or not applicable: Secondary | ICD-10-CM | POA: Insufficient documentation

## 2018-08-22 DIAGNOSIS — S6010XA Contusion of unspecified finger with damage to nail, initial encounter: Secondary | ICD-10-CM | POA: Insufficient documentation

## 2018-08-22 DIAGNOSIS — Y939 Activity, unspecified: Secondary | ICD-10-CM | POA: Insufficient documentation

## 2018-08-22 DIAGNOSIS — W230XXA Caught, crushed, jammed, or pinched between moving objects, initial encounter: Secondary | ICD-10-CM | POA: Insufficient documentation

## 2018-08-22 DIAGNOSIS — M79645 Pain in left finger(s): Secondary | ICD-10-CM

## 2018-08-22 MED ORDER — IBUPROFEN 400 MG PO TABS: 400.0000 mg | ORAL_TABLET | Freq: Four times a day (QID) | ORAL | 0 refills | Status: AC | PRN

## 2018-08-22 MED ORDER — IBUPROFEN 400 MG PO TABS
400.0000 mg | ORAL_TABLET | Freq: Once | ORAL | Status: AC
  Administered 2018-08-22: 400 mg via ORAL
  Filled 2018-08-22: qty 1

## 2018-08-22 MED ORDER — IBUPROFEN 400 MG PO TABS: 600.0000 mg | ORAL_TABLET | Freq: Once | ORAL | Status: DC

## 2018-08-22 MED ORDER — ACETAMINOPHEN 325 MG PO TABS: 650.0000 mg | ORAL_TABLET | Freq: Four times a day (QID) | ORAL | 0 refills | Status: AC | PRN

## 2018-08-22 NOTE — ED Notes (Signed)
Patient transported to X-ray 

## 2018-08-22 NOTE — ED Triage Notes (Signed)
Per pt: Yesterday she slammed her left thumb in the car door. She opened the door back up and removed her thumb. Pts thumbnail is purple. Pt reports numbness to the thumb along with an 8/10 pain. Pt is able to move the thumb, but not the joint closes to the tip of the thumb. No meds PTA.

## 2018-08-22 NOTE — ED Notes (Signed)
Brittany NP at bedside.   

## 2018-08-22 NOTE — ED Provider Notes (Signed)
[facility] Note   CSN: [id number] Arrival date & time: 08/22/18  1240  History   Chief Complaint Chief Complaint  Patient presents with  . Finger Injury    Left thumb    HPI Michaela Gibson is a 18 y.o. female with a past medical history of asthma who presents to the emergency department for a left thumb injury. Yesterday, patient reports that she accidentally slammed her left thumb in a car door. Today, she noticed blood under her fingernail and states that her pain in her left thumb is worsening. She reports intermittent tingling in her left thumb. She denies any other injuries. She is UTD with vaccines. No medications or attempted therapies prior to arrival. UTD with tetanus. No fevers or known sick contacts.      The history is provided by the patient. No language interpreter was used.    Past Medical History:  Diagnosis Date  . Asthma     There are no active problems to display for this patient.   History reviewed. No pertinent surgical history.   OB History   No obstetric history on file.      Home Medications    Prior to Admission medications   Medication Sig Start Date End Date Taking? Authorizing Provider  acetaminophen (TYLENOL) 325 MG tablet Take 2 tablets (650 mg total) by mouth every 6 (six) hours as needed for up to 3 days for mild pain or moderate pain. 08/22/18 08/25/18  Sherrilee GillesScoville, Brittany N, NP  ibuprofen (ADVIL) 400 MG tablet Take 1 tablet (400 mg total) by mouth every 6 (six) hours as needed for up to 3 days for mild pain or moderate pain. 08/22/18 08/25/18  Sherrilee GillesScoville, Brittany N, NP    Family History Family History  Problem Relation Age of Onset  . Healthy Mother   . Healthy Father     Social History Social History   Tobacco Use  . Smoking status: Never Smoker  . Smokeless tobacco: Never Used  Substance Use Topics  . Alcohol use: No  . Drug use: Not on file     Allergies   Patient has no known  allergies.   Review of Systems Review of Systems  Constitutional: Negative for activity change, appetite change, fatigue and fever.  Musculoskeletal:       Left thumb injury.   All other systems reviewed and are negative.    Physical Exam Updated Vital Signs BP 110/74   Pulse 68   Temp 98.2 F (36.8 C) (Oral)   Resp 16   SpO2 99%   Physical Exam Vitals signs and nursing note reviewed.  Constitutional:      General: She is not in acute distress.    Appearance: Normal appearance. She is well-developed.  HENT:     Head: Normocephalic and atraumatic.     Right Ear: Tympanic membrane and external ear normal.     Left Ear: Tympanic membrane and external ear normal.     Nose: Nose normal.     Mouth/Throat:     Pharynx: Uvula midline.  Eyes:     General: Lids are normal. No scleral icterus.    Conjunctiva/sclera: Conjunctivae normal.     Pupils: Pupils are equal, round, and reactive to light.  Neck:     Musculoskeletal: Full passive range of motion without pain and neck supple.  Cardiovascular:     Rate and Rhythm: Normal rate.     Heart sounds: Normal heart sounds. No murmur.  Pulmonary:     Effort: Pulmonary effort is normal.     Breath sounds: Normal breath sounds.  Chest:     Chest wall: No tenderness.  Abdominal:     General: Bowel sounds are normal.     Palpations: Abdomen is soft.     Tenderness: There is no abdominal tenderness.  Musculoskeletal:     Left wrist: Normal.     Left hand: She exhibits decreased range of motion and tenderness. She exhibits normal capillary refill, no deformity and no swelling.     Comments: Left thumb with decreased ROM and generalized ttp with subungual hematoma present. The left thumb nail and nail bed are intact. No swelling or deformities of the left thumb. Patient remains NVI. She is able to move all other digits of the left hand without difficulty.   Lymphadenopathy:     Cervical: No cervical adenopathy.  Skin:    General:  Skin is warm and dry.     Capillary Refill: Capillary refill takes less than 2 seconds.  Neurological:     Mental Status: She is alert and oriented to person, place, and time.     Coordination: Coordination normal.     Gait: Gait normal.      ED Treatments / Results  Labs (all labs ordered are listed, but only abnormal results are displayed) Labs Reviewed - No data to display  EKG None  Radiology Dg Hand Complete Left  Result Date: 08/22/2018 CLINICAL DATA:  Slammed thumb in a door yesterday. EXAM: LEFT HAND - COMPLETE 3+ VIEW COMPARISON:  None. FINDINGS: There is no evidence of fracture or dislocation. There is no evidence of arthropathy or other focal bone abnormality. Soft tissues are unremarkable. IMPRESSION: Negative. Electronically Signed   By: Obie Dredge M.D.   On: 08/22/2018 14:02    Procedures .Marland KitchenIncision and Drainage Date/Time: 08/23/2018 5:11 PM Performed by: Sherrilee Gilles, NP Authorized by: Sherrilee Gilles, NP   Consent:    Consent obtained:  Verbal   Consent given by:  Patient   Risks discussed:  Bleeding, incomplete drainage and pain   Alternatives discussed:  No treatment Universal protocol:    Site/side marked: yes     Immediately prior to procedure a time out was called: yes     Patient identity confirmed:  Verbally with patient and arm band Location:    Type:  Subungual hematoma   Location:  Upper extremity   Upper extremity location:  Hand   Hand location:  L hand Pre-procedure details:    Skin preparation:  Betadine Anesthesia (see MAR for exact dosages):    Anesthesia method:  None Procedure type:    Complexity:  Simple Procedure details:    Incision type: Bovi pen used to trephenate left thumb nail.   Wound management:  Irrigated with saline   Drainage:  Bloody   Drainage amount:  Scant   Wound treatment:  Wound left open   Packing materials:  None Post-procedure details:    Patient tolerance of procedure:  Tolerated well,  no immediate complications   (including critical care time)  Medications Ordered in ED Medications  ibuprofen (ADVIL) tablet 400 mg (400 mg Oral Given 08/22/18 1255)     Initial Impression / Assessment and Plan / ED Course  I have reviewed the triage vital signs and the nursing notes.  Pertinent labs & imaging results that were available during my care of the patient were reviewed by me and considered in my medical decision  making (see chart for details).       18yo female with left thumb injury that occurred after she shut it in a car door yesterday. On exam, she is in no acute distress. VSS, afebrile. Left thumb with decreased ROM and generalized ttp with subungual hematoma present. No swelling or deformities of the left thumb. Patient remains NVI. She is able to move all other digits of the left hand without difficulty. Will obtain x-ray to assess for fracture. Ibuprofen given for pain.   X-ray of the left head is negative. Gave patient the option of trephinating the left thumb nail d/t present of subungual hematoma. Patient is agreeable. Left thumb nail was trephinated at bedside without immediate complication - see procedure note for detail. Patient reports improvement of pain after trephination was performed. Will recommended supportive care and close PCP f/u.     Discussed supportive care as well as need for f/u w/ PCP in the next 1-2 days.  Also discussed sx that warrant sooner re-evaluation in emergency department. Family / patient/ caregiver informed of clinical course, understand medical decision-making process, and agree with plan.  Final Clinical Impressions(s) / ED Diagnoses   Final diagnoses:  Subungual hematoma of digit of hand, initial encounter  Pain of left thumb    ED Discharge Orders         Ordered    acetaminophen (TYLENOL) 325 MG tablet  Every 6 hours PRN     08/22/18 1418    ibuprofen (ADVIL) 400 MG tablet  Every 6 hours PRN     08/22/18 1418            Sherrilee Gilles, NP 08/23/18 1712    Niel Hummer, MD 08/25/18 [phone]

## 2018-08-22 NOTE — ED Notes (Signed)
Dr Kuhner at bedside 

## 2019-01-22 ENCOUNTER — Encounter (HOSPITAL_COMMUNITY): Payer: Self-pay | Admitting: Emergency Medicine

## 2019-01-22 ENCOUNTER — Other Ambulatory Visit: Payer: Self-pay

## 2019-01-22 ENCOUNTER — Emergency Department (HOSPITAL_COMMUNITY)
Admission: EM | Admit: 2019-01-22 | Discharge: 2019-01-23 | Disposition: A | Discharge status: Home / Self Care | Payer: Medicaid Other | Attending: Emergency Medicine | Admitting: Emergency Medicine

## 2019-01-22 DIAGNOSIS — R102 Pelvic and perineal pain: Secondary | ICD-10-CM | POA: Insufficient documentation

## 2019-01-22 DIAGNOSIS — N939 Abnormal uterine and vaginal bleeding, unspecified: Secondary | ICD-10-CM | POA: Insufficient documentation

## 2019-01-22 NOTE — ED Triage Notes (Signed)
Patient reports vaginal bleeding this morning with low abdominal cramping , no emesis or diarrhea , denies fever or chills .

## 2019-01-23 LAB — URINALYSIS, ROUTINE W REFLEX MICROSCOPIC
Bilirubin Urine: NEGATIVE
Glucose, UA: NEGATIVE mg/dL
Ketones, ur: NEGATIVE mg/dL
Leukocytes,Ua: NEGATIVE
Nitrite: NEGATIVE
Protein, ur: 30 mg/dL — AB
Specific Gravity, Urine: 1.033 — ABNORMAL HIGH (ref 1.005–1.030)
pH: 6 (ref 5.0–8.0)

## 2019-01-23 LAB — WET PREP, GENITAL
Sperm: NONE SEEN
Trich, Wet Prep: NONE SEEN
Yeast Wet Prep HPF POC: NONE SEEN

## 2019-01-23 LAB — COMPREHENSIVE METABOLIC PANEL
ALT: 18 U/L (ref 0–44)
AST: 23 U/L (ref 15–41)
Albumin: 4 g/dL (ref 3.5–5.0)
Alkaline Phosphatase: 63 U/L (ref 38–126)
Anion gap: 10 (ref 5–15)
BUN: 15 mg/dL (ref 6–20)
CO2: 25 mmol/L (ref 22–32)
Calcium: 8.9 mg/dL (ref 8.9–10.3)
Chloride: 103 mmol/L (ref 98–111)
Creatinine, Ser: 0.69 mg/dL (ref 0.44–1.00)
GFR calc Af Amer: >60 mL/min (ref >60)
GFR calc non Af Amer: >60 mL/min (ref >60)
Glucose, Bld: 96 mg/dL (ref 70–99)
Potassium: 3.4 mmol/L — ABNORMAL LOW (ref 3.5–5.1)
Sodium: 138 mmol/L (ref 135–145)
Total Bilirubin: 1 mg/dL (ref 0.3–1.2)
Total Protein: 6.4 g/dL — ABNORMAL LOW (ref 6.5–8.1)

## 2019-01-23 LAB — CBC WITH DIFFERENTIAL/PLATELET
Abs Immature Granulocytes: 0.05 10*3/uL (ref 0.00–0.07)
Basophils Absolute: 0 10*3/uL (ref 0.0–0.1)
Basophils Relative: 0 %
Eosinophils Absolute: 0.1 10*3/uL (ref 0.0–0.5)
Eosinophils Relative: 1 %
HCT: 38.9 % (ref 36.0–46.0)
Hemoglobin: 12.4 g/dL (ref 12.0–15.0)
Immature Granulocytes: 1 %
Lymphocytes Relative: 31 %
Lymphs Abs: 3 10*3/uL (ref 0.7–4.0)
MCH: 30.5 pg (ref 26.0–34.0)
MCHC: 31.9 g/dL (ref 30.0–36.0)
MCV: 95.8 fL (ref 80.0–100.0)
Monocytes Absolute: 1.1 10*3/uL — ABNORMAL HIGH (ref 0.1–1.0)
Monocytes Relative: 11 %
Neutro Abs: 5.3 10*3/uL (ref 1.7–7.7)
Neutrophils Relative %: 56 %
Platelets: 149 10*3/uL — ABNORMAL LOW (ref 150–400)
RBC: 4.06 MIL/uL (ref 3.87–5.11)
RDW: 11.2 % — ABNORMAL LOW (ref 11.5–15.5)
WBC: 9.5 10*3/uL (ref 4.0–10.5)
nRBC: 0 % (ref 0.0–0.2)

## 2019-01-23 LAB — I-STAT BETA HCG BLOOD, ED (MC, WL, AP ONLY): I-stat hCG, quantitative: <5 m[IU]/mL (ref <5)

## 2019-01-23 MED ORDER — IBUPROFEN 600 MG PO TABS: 600.0000 mg | ORAL_TABLET | Freq: Four times a day (QID) | ORAL | 0 refills | Status: DC | PRN

## 2019-01-23 MED ORDER — IBUPROFEN 400 MG PO TABS
600.0000 mg | ORAL_TABLET | Freq: Once | ORAL | Status: AC
  Administered 2019-01-23: 600 mg via ORAL
  Filled 2019-01-23: qty 1

## 2019-01-23 NOTE — ED Provider Notes (Signed)
[facility] EMERGENCY DEPARTMENT Provider Note   CSN: [id number] Arrival date & time: 01/22/19  2340     History   Chief Complaint Chief Complaint  Patient presents with   Vaginal Bleeding    HPI Michaela Gibson is a 18 y.o. female.     Michaela Gibson is a 18 y.o. female with a history of asthma, who presents to the ED for evaluation of vaginal bleeding.  She reports that she woke up with significant bleeding with blood clots noted.  She reports some associated lower abdominal cramping that has been intermittent.  While waiting here in the ED she does report that the bleeding has slowed down.  She reports that her menstrual period was supposed to come next week.  She was previously on Depo-Provera and has been having irregular periods since then but this is much heavier bleeding than usual.  She is sexually active without protection but does not think that she is pregnant.  She denies any vaginal discharge.  No dysuria or urinary frequency.  No fevers or chills, no nausea or vomiting.  No lightheadedness or syncope.  She is not currently on any birth control, does not have an OB/GYN that she follows with.     Past Medical History:  Diagnosis Date   Asthma     There are no active problems to display for this patient.   History reviewed. No pertinent surgical history.   OB History   No obstetric history on file.      Home Medications    Prior to Admission medications   Not on File    Family History Family History  Problem Relation Age of Onset   Healthy Mother    Healthy Father     Social History Social History   Tobacco Use   Smoking status: Never Smoker   Smokeless tobacco: Never Used  Substance Use Topics   Alcohol use: No   Drug use: Not on file     Allergies   Patient has no known allergies.   Review of Systems Review of Systems  Constitutional: Negative for chills and fever.  Respiratory: Negative for cough and shortness  of breath.   Cardiovascular: Negative for chest pain.  Gastrointestinal: Positive for abdominal pain. Negative for blood in stool, diarrhea, nausea and vomiting.  Genitourinary: Positive for pelvic pain and vaginal bleeding. Negative for dysuria, flank pain, frequency, hematuria and vaginal discharge.  Musculoskeletal: Negative for arthralgias and myalgias.  Skin: Negative for color change and rash.  Neurological: Negative for dizziness, syncope and light-headedness.     Physical Exam Updated Vital Signs BP 134/75 (BP Location: Right Arm)    Pulse 72    Temp 98.3 F (36.8 C) (Oral)    Resp 15    Ht 5\' 2"  (1.575 m)    Wt 44.5 kg    LMP 01/01/2019    SpO2 100%    BMI 17.92 kg/m   Physical Exam Vitals signs and nursing note reviewed. Exam conducted with a chaperone present.  Constitutional:      General: She is not in acute distress.    Appearance: Normal appearance. She is well-developed and normal weight. She is not ill-appearing or diaphoretic.  HENT:     Head: Normocephalic and atraumatic.  Eyes:     General:        Right eye: No discharge.        Left eye: No discharge.  Neck:     Musculoskeletal: Neck supple.  Cardiovascular:     Rate and Rhythm: Normal rate and regular rhythm.     Heart sounds: Normal heart sounds.  Pulmonary:     Effort: Pulmonary effort is normal. No respiratory distress.     Breath sounds: Normal breath sounds. No wheezing or rales.     Comments: Respirations equal and unlabored, patient able to speak in full sentences, lungs clear to auscultation bilaterally Abdominal:     General: Bowel sounds are normal. There is no distension.     Palpations: Abdomen is soft. There is no mass.     Tenderness: There is abdominal tenderness. There is no guarding.     Comments: Abdomen is soft, nondistended, bowel sounds present throughout, patient reports some very mild tenderness across the lower abdomen that does not localize to 1 side, there is no guarding or  peritoneal signs.  No CVA tenderness.  Genitourinary:    Comments: Chaperone present during pelvic exam. No external genital lesions noted. Speculum exam reveals a small amount of blood pooled in the vaginal vault, no clots, no active bleeding from the cervix, no discharge noted. On bimanual exam patient has some very mild discomfort but no focal cervical motion tenderness or adnexal tenderness or palpable masses. Musculoskeletal:        General: No deformity.  Skin:    General: Skin is warm and dry.     Capillary Refill: Capillary refill takes less than 2 seconds.  Neurological:     Mental Status: She is alert.     Coordination: Coordination normal.     Comments: Speech is clear, able to follow commands Moves extremities without ataxia, coordination intact  Psychiatric:        Mood and Affect: Mood normal.        Behavior: Behavior normal.      ED Treatments / Results  Labs (all labs ordered are listed, but only abnormal results are displayed) Labs Reviewed  WET PREP, GENITAL - Abnormal; Notable for the following components:      Result Value   Clue Cells Wet Prep HPF POC PRESENT (*)    WBC, Wet Prep HPF POC FEW (*)    All other components within normal limits  URINALYSIS, ROUTINE W REFLEX MICROSCOPIC - Abnormal; Notable for the following components:   APPearance CLOUDY (*)    Specific Gravity, Urine 1.033 (*)    Hgb urine dipstick MODERATE (*)    Protein, ur 30 (*)    Bacteria, UA RARE (*)    All other components within normal limits  CBC WITH DIFFERENTIAL/PLATELET - Abnormal; Notable for the following components:   RDW 11.2 (*)    Platelets 149 (*)    Monocytes Absolute 1.1 (*)    All other components within normal limits  COMPREHENSIVE METABOLIC PANEL - Abnormal; Notable for the following components:   Potassium 3.4 (*)    Total Protein 6.4 (*)    All other components within normal limits  I-STAT BETA HCG BLOOD, ED ([facility], [facility], [facility] ONLY)  GC/CHLAMYDIA PROBE AMP ([facility]) NOT AT [facility]    EKG None  Radiology No results found.  Procedures Procedures (including critical care time)  Medications Ordered in ED Medications  ibuprofen (ADVIL) tablet 600 mg (has no administration in time range)     Initial Impression / Assessment and Plan / ED Course  I have reviewed the triage vital signs and the nursing notes.  Pertinent labs & imaging results that were available during my care of the patient  were reviewed by me and considered in my medical decision making (see chart for details).  18 year old female presents for evaluation after episode of heavy vaginal bleeding about a week earlier than her expected menstrual cycle, she reports some associated lower abdominal cramping, no vaginal discharge, no fevers or vomiting.  Pregnancy test is negative.  While waiting here in the ED bleeding has significantly slowed.  Her hemoglobin is normal and other labs are unremarkable, no evidence of UTI, wet prep with only a few clue cells and WBCs and she is asymptomatic with this.  GC chlamydia pending.  On exam she has no focal adnexal tenderness or cervical motion tenderness do not see any benefit in pelvic ultrasound. I suspect this is abnormal uterine bleeding patient stopped Depo earlier this year and I think this is likely the cause of her abnormal periods.  We will have her take ibuprofen 600 mg every 6 hours to help with pain and bleeding.  OB/GYN follow-up recommended.  Return precautions discussed.  Patient expresses understanding and agreement.  Discharged home in good condition.  Final Clinical Impressions(s) / ED Diagnoses   Final diagnoses:  Vaginal bleeding    ED Discharge Orders         Ordered    ibuprofen (ADVIL) 600 MG tablet  Every 6 hours PRN     01/23/19 1034           Dartha LodgeFord, Annmargaret Decaprio N, [place]-C 01/23/19 1035    Glynn Octaveancour, Stephen, MD 01/23/19 1558

## 2019-01-23 NOTE — Discharge Instructions (Signed)
Please take ibuprofen 600 mg every 6 hours, this will help with bleeding and pain.  Please call to schedule follow-up appointment with the women's health clinic.  You have STD testing pending you will be contacted with any positive results, if so please notify any partners so they can be tested as well you can go to the health department or your OB/GYN for any necessary treatment.  If bleeding increases, you feel lightheaded or like you may pass out, have worsening pain or any other new or concerning symptoms return to the ED.

## 2019-01-26 LAB — GC/CHLAMYDIA PROBE AMP (~~LOC~~) NOT AT ARMC
Chlamydia: NEGATIVE
Neisseria Gonorrhea: POSITIVE — AB

## 2019-03-20 NOTE — L&D Delivery Note (Signed)
Delivery Note Admitted at 0502, with PROM and contractions.  Cervix was 2cm  Within a short time she progressed to complete dilation and pushed 3 times to delivery   At 5:43 AM a viable and healthy female was delivered via Vaginal, Spontaneous (Presentation: Left Occiput Anterior).  APGAR: 8, 9; weight 5 lb 8.2 oz (2500 g).   Placenta status: Spontaneous;Pathology, Intact.  Cord: 3 vessels with the following complications: Short (6-8 inches) sent to pathology.    Anesthesia: Local Episiotomy: None Lacerations: 1st degree Suture Repair: 3.0 vicryl rapide Est. Blood Loss (mL): 160  Mom to postpartum.  Baby to Couplet care / Skin to Skin.  Wynelle Bourgeois 01/07/2020, 7:25 AM

## 2019-07-07 DIAGNOSIS — Z3490 Encounter for supervision of normal pregnancy, unspecified, unspecified trimester: Secondary | ICD-10-CM | POA: Insufficient documentation

## 2019-07-08 ENCOUNTER — Other Ambulatory Visit: Payer: Self-pay

## 2019-07-08 ENCOUNTER — Other Ambulatory Visit (HOSPITAL_COMMUNITY)
Admission: RE | Admit: 2019-07-08 | Discharge: 2019-07-08 | Disposition: A | Discharge status: Home / Self Care | Payer: Medicaid Other | Source: Ambulatory Visit

## 2019-07-08 ENCOUNTER — Ambulatory Visit (INDEPENDENT_AMBULATORY_CARE_PROVIDER_SITE_OTHER): Payer: 59

## 2019-07-08 VITALS — BP 103/60 | HR 87 | Temp 98.6°F | Wt 98.0 lb

## 2019-07-08 DIAGNOSIS — Z3490 Encounter for supervision of normal pregnancy, unspecified, unspecified trimester: Secondary | ICD-10-CM | POA: Diagnosis not present

## 2019-07-08 DIAGNOSIS — O98819 Other maternal infectious and parasitic diseases complicating pregnancy, unspecified trimester: Secondary | ICD-10-CM

## 2019-07-08 DIAGNOSIS — A749 Chlamydial infection, unspecified: Secondary | ICD-10-CM

## 2019-07-08 DIAGNOSIS — Z3401 Encounter for supervision of normal first pregnancy, first trimester: Secondary | ICD-10-CM | POA: Diagnosis not present

## 2019-07-08 DIAGNOSIS — R636 Underweight: Secondary | ICD-10-CM

## 2019-07-08 DIAGNOSIS — Z3A13 13 weeks gestation of pregnancy: Secondary | ICD-10-CM | POA: Diagnosis not present

## 2019-07-08 DIAGNOSIS — T7411XA Adult physical abuse, confirmed, initial encounter: Secondary | ICD-10-CM | POA: Insufficient documentation

## 2019-07-08 MED ORDER — BLOOD PRESSURE KIT DEVI: 1.0000 | 0 refills | Status: DC

## 2019-07-08 NOTE — Patient Instructions (Signed)
How to Take Your Blood Pressure Blood pressure is a measurement of how strongly your blood is pressing against the walls of your arteries. Arteries are blood vessels that carry blood from your heart throughout your body. Your health care provider takes your blood pressure at each office visit. You can also take your own blood pressure at home with a blood pressure machine. You may need to take your own blood pressure:  To confirm a diagnosis of high blood pressure (hypertension).  To monitor your blood pressure over time.  To make sure your blood pressure medicine is working. Supplies needed: To take your blood pressure, you will need a blood pressure machine. You can buy a blood pressure machine, or blood pressure monitor, at most drugstores or online. There are several types of home blood pressure monitors. When choosing one, consider the following:  Choose a monitor that has an arm cuff.  Choose a cuff that wraps snugly around your upper arm. You should be able to fit only one finger between your arm and the cuff.  Do not choose a monitor that measures your blood pressure from your wrist or finger. Your health care provider can suggest a reliable monitor that will meet your needs. How to prepare To get the most accurate reading, avoid the following for 30 minutes before you check your blood pressure:  Drinking caffeine.  Drinking alcohol.  Eating.  Smoking.  Exercising. Five minutes before you check your blood pressure:  Empty your bladder.  Sit quietly without talking in a dining chair, rather than in a soft couch or armchair. How to take your blood pressure To check your blood pressure, follow the instructions in the manual that came with your blood pressure monitor. If you have a digital blood pressure monitor, the instructions may be as follows: 1. Sit up straight. 2. Place your feet on the floor. Do not cross your ankles or legs. 3. Rest your left arm at the level of  your heart on a table or desk or on the arm of a chair. 4. Pull up your shirt sleeve. 5. Wrap the blood pressure cuff around the upper part of your left arm, 1 inch (2.5 cm) above your elbow. It is best to wrap the cuff around bare skin. 6. Fit the cuff snugly around your arm. You should be able to place only one finger between the cuff and your arm. 7. Position the cord inside the groove of your elbow. 8. Press the power button. 9. Sit quietly while the cuff inflates and deflates. 10. Read the digital reading on the monitor screen and write it down (record it). 11. Wait 2-3 minutes, then repeat the steps, starting at step 1. What does my blood pressure reading mean? A blood pressure reading consists of a higher number over a lower number. Ideally, your blood pressure should be below 120/80. The first ("top") number is called the systolic pressure. It is a measure of the pressure in your arteries as your heart beats. The second ("bottom") number is called the diastolic pressure. It is a measure of the pressure in your arteries as the heart relaxes. Blood pressure is classified into four stages. The following are the stages for adults who do not have a short-term serious illness or a chronic condition. Systolic pressure and diastolic pressure are measured in a unit called mm Hg. Normal  Systolic pressure: below 120.  Diastolic pressure: below 80. Elevated  Systolic pressure: 120-129.  Diastolic pressure: below 80. Hypertension stage   1  Systolic pressure: 130-139.  Diastolic pressure: 80-89. Hypertension stage 2  Systolic pressure: 140 or above.  Diastolic pressure: 90 or above. You can have prehypertension or hypertension even if only the systolic or only the diastolic number in your reading is higher than normal. Follow these instructions at home:  Check your blood pressure as often as recommended by your health care provider.  Take your monitor to the next appointment with your  health care provider to make sure: ? That you are using it correctly. ? That it provides accurate readings.  Be sure you understand what your goal blood pressure numbers are.  Tell your health care provider if you are having any side effects from blood pressure medicine. Contact a health care provider if:  Your blood pressure is consistently high. Get help right away if:  Your systolic blood pressure is higher than 180.  Your diastolic blood pressure is higher than 110. This information is not intended to replace advice given to you by your health care provider. Make sure you discuss any questions you have with your health care provider. Document Revised: 02/15/2017 Document Reviewed: 08/12/2015 Elsevier Patient Education  2020 [employer]  
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
## 2019-07-08 NOTE — Progress Notes (Signed)
New OB, reports no problems today  

## 2019-07-09 DIAGNOSIS — Z283 Underimmunization status: Secondary | ICD-10-CM | POA: Insufficient documentation

## 2019-07-09 DIAGNOSIS — O09899 Supervision of other high risk pregnancies, unspecified trimester: Secondary | ICD-10-CM | POA: Insufficient documentation

## 2019-07-09 LAB — OBSTETRIC PANEL, INCLUDING HIV
Antibody Screen: NEGATIVE
Basophils Absolute: 0 10*3/uL (ref 0.0–0.2)
Basos: 1 %
EOS (ABSOLUTE): 0 10*3/uL (ref 0.0–0.4)
Eos: 1 %
HIV Screen 4th Generation wRfx: NONREACTIVE
Hematocrit: 38.4 % (ref 34.0–46.6)
Hemoglobin: 12.6 g/dL (ref 11.1–15.9)
Hepatitis B Surface Ag: NEGATIVE
Immature Grans (Abs): 0.1 10*3/uL (ref 0.0–0.1)
Immature Granulocytes: 1 %
Lymphocytes Absolute: 1.7 10*3/uL (ref 0.7–3.1)
Lymphs: 19 %
MCH: 30.7 pg (ref 26.6–33.0)
MCHC: 32.8 g/dL (ref 31.5–35.7)
MCV: 94 fL (ref 79–97)
Monocytes Absolute: 0.9 10*3/uL (ref 0.1–0.9)
Monocytes: 10 %
Neutrophils Absolute: 6.1 10*3/uL (ref 1.4–7.0)
Neutrophils: 68 %
Platelets: 175 10*3/uL (ref 150–450)
RBC: 4.1 x10E6/uL (ref 3.77–5.28)
RDW: 11.1 % — ABNORMAL LOW (ref 11.7–15.4)
RPR Ser Ql: NONREACTIVE
Rh Factor: POSITIVE
Rubella Antibodies, IGG: <0.9 index — ABNORMAL LOW (ref >0.99)
WBC: 8.8 10*3/uL (ref 3.4–10.8)

## 2019-07-09 LAB — CERVICOVAGINAL ANCILLARY ONLY
Bacterial Vaginitis (gardnerella): POSITIVE — AB
Candida Glabrata: NEGATIVE
Candida Vaginitis: NEGATIVE
Chlamydia: NEGATIVE
Comment: NEGATIVE
Comment: NEGATIVE
Comment: NEGATIVE
Comment: NEGATIVE
Comment: NEGATIVE
Comment: NORMAL
Neisseria Gonorrhea: NEGATIVE
Trichomonas: NEGATIVE

## 2019-07-09 LAB — HEPATITIS C ANTIBODY: Hep C Virus Ab: <0.1 s/co ratio (ref 0.0–0.9)

## 2019-07-10 ENCOUNTER — Other Ambulatory Visit: Payer: Self-pay

## 2019-07-10 DIAGNOSIS — N760 Acute vaginitis: Secondary | ICD-10-CM

## 2019-07-10 DIAGNOSIS — B9689 Other specified bacterial agents as the cause of diseases classified elsewhere: Secondary | ICD-10-CM

## 2019-07-10 LAB — CULTURE, OB URINE

## 2019-07-10 LAB — URINE CULTURE, OB REFLEX

## 2019-07-10 MED ORDER — METRONIDAZOLE 0.75 % VA GEL: 1.0000 | Freq: Every day | VAGINAL | 0 refills | Status: DC

## 2019-07-14 ENCOUNTER — Encounter: Payer: Self-pay | Admitting: Emergency Medicine

## 2019-07-14 ENCOUNTER — Other Ambulatory Visit: Payer: Self-pay

## 2019-07-14 ENCOUNTER — Emergency Department
Admission: EM | Admit: 2019-07-14 | Discharge: 2019-07-14 | Disposition: A | Discharge status: Home / Self Care | Payer: Medicaid Other | Attending: Emergency Medicine | Admitting: Emergency Medicine

## 2019-07-14 DIAGNOSIS — R0789 Other chest pain: Secondary | ICD-10-CM | POA: Diagnosis not present

## 2019-07-14 DIAGNOSIS — O99891 Other specified diseases and conditions complicating pregnancy: Secondary | ICD-10-CM | POA: Diagnosis present

## 2019-07-14 DIAGNOSIS — J45909 Unspecified asthma, uncomplicated: Secondary | ICD-10-CM | POA: Insufficient documentation

## 2019-07-14 DIAGNOSIS — Z3A14 14 weeks gestation of pregnancy: Secondary | ICD-10-CM | POA: Diagnosis not present

## 2019-07-14 DIAGNOSIS — R42 Dizziness and giddiness: Secondary | ICD-10-CM | POA: Insufficient documentation

## 2019-07-14 DIAGNOSIS — O99512 Diseases of the respiratory system complicating pregnancy, second trimester: Secondary | ICD-10-CM | POA: Insufficient documentation

## 2019-07-14 LAB — URINALYSIS, COMPLETE (UACMP) WITH MICROSCOPIC
Bacteria, UA: NONE SEEN
Bilirubin Urine: NEGATIVE
Glucose, UA: NEGATIVE mg/dL
Hgb urine dipstick: NEGATIVE
Ketones, ur: NEGATIVE mg/dL
Leukocytes,Ua: NEGATIVE
Nitrite: NEGATIVE
Protein, ur: NEGATIVE mg/dL
Specific Gravity, Urine: 1.014 (ref 1.005–1.030)
WBC, UA: NONE SEEN WBC/hpf (ref 0–5)
pH: 7 (ref 5.0–8.0)

## 2019-07-14 LAB — CBC
HCT: 35 % — ABNORMAL LOW (ref 36.0–46.0)
Hemoglobin: 11.5 g/dL — ABNORMAL LOW (ref 12.0–15.0)
MCH: 30.7 pg (ref 26.0–34.0)
MCHC: 32.9 g/dL (ref 30.0–36.0)
MCV: 93.6 fL (ref 80.0–100.0)
Platelets: 153 10*3/uL (ref 150–400)
RBC: 3.74 MIL/uL — ABNORMAL LOW (ref 3.87–5.11)
RDW: 11.5 % (ref 11.5–15.5)
WBC: 10.7 10*3/uL — ABNORMAL HIGH (ref 4.0–10.5)
nRBC: 0 % (ref 0.0–0.2)

## 2019-07-14 LAB — BASIC METABOLIC PANEL
Anion gap: 7 (ref 5–15)
BUN: 11 mg/dL (ref 6–20)
CO2: 24 mmol/L (ref 22–32)
Calcium: 8.6 mg/dL — ABNORMAL LOW (ref 8.9–10.3)
Chloride: 104 mmol/L (ref 98–111)
Creatinine, Ser: 0.42 mg/dL — ABNORMAL LOW (ref 0.44–1.00)
GFR calc Af Amer: >60 mL/min (ref >60)
GFR calc non Af Amer: >60 mL/min (ref >60)
Glucose, Bld: 85 mg/dL (ref 70–99)
Potassium: 3.4 mmol/L — ABNORMAL LOW (ref 3.5–5.1)
Sodium: 135 mmol/L (ref 135–145)

## 2019-07-14 LAB — POCT PREGNANCY, URINE: Preg Test, Ur: POSITIVE — AB

## 2019-07-14 MED ORDER — SODIUM CHLORIDE 0.9% FLUSH: 3.0000 mL | Freq: Once | INTRAVENOUS | Status: DC

## 2019-07-14 NOTE — ED Notes (Signed)
MD at bedside with [place] to visualize baby and HR.

## 2019-07-14 NOTE — ED Triage Notes (Signed)
Pt presents to ED with sudden onset of dizziness and shortness of breath while she was sitting down. Currently [redacted] weeks pregnant and was worried about her pregnancy and fetus. States her symptoms have almost completely resolved and is only feeling dizzy at the moment.  Pt ambulatory to triage with steady gait. Able to answer all questions without difficulty.
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
## 2019-07-14 NOTE — ED Notes (Signed)
Pt reports feeling light headed- approx 14 weeks preg. States that she has been having nausea so hasn't been eating drinking quite as normal as she would. Denies pain.

## 2019-07-23 ENCOUNTER — Encounter: Payer: Medicaid Other | Admitting: Registered"

## 2019-07-23 ENCOUNTER — Other Ambulatory Visit: Payer: Self-pay

## 2019-07-23 ENCOUNTER — Ambulatory Visit: Payer: 59 | Admitting: Registered"

## 2019-07-23 VITALS — Ht 61.0 in | Wt 99.3 lb

## 2019-07-23 DIAGNOSIS — Z713 Dietary counseling and surveillance: Secondary | ICD-10-CM

## 2019-07-23 DIAGNOSIS — Z3A00 Weeks of gestation of pregnancy not specified: Secondary | ICD-10-CM | POA: Diagnosis not present

## 2019-07-23 DIAGNOSIS — Z681 Body mass index (BMI) 19 or less, adult: Secondary | ICD-10-CM | POA: Diagnosis not present

## 2019-07-23 DIAGNOSIS — R636 Underweight: Secondary | ICD-10-CM | POA: Diagnosis not present

## 2019-07-23 DIAGNOSIS — O26899 Other specified pregnancy related conditions, unspecified trimester: Secondary | ICD-10-CM | POA: Diagnosis not present

## 2019-07-23 NOTE — Progress Notes (Signed)
Medical Nutrition Therapy:  Appt start time: 1315 end time:  1400.  Assessment:  Primary concerns today: Underweight and fatigue. Pt reports craving to chew ice and states she wants ideas to increase iron through diet.   Patient states she works nights, wakes up about noon but is usually not hungry until 1:30-2 in the afternoon and may not eat again until a snack before work at 5 pm. [employer] usually at 7:30 pm which often is fast food. Next meal is 2 am after work, usually has something convenient because patient states she is tired.  Wt Readings from Last 3 Encounters:  07/23/19 99 lb 4.8 oz (45 kg)  07/14/19 98 lb (44.5 kg)  07/08/19 98 lb (44.5 kg)   Recommended weight gain in pregnancy with pre-pregnancy BMI <18 total weight gain 28-40 lbs 2nd & 3rd trimester 1-1.3 lb/week  Pt states she is lactose intolerant. Pt reports past history of strategies to gain weight including drinking Ensure. Pt states when she lived with her father he used to buy them for her.  Preferred Learning Style:   No preference indicated   Learning Readiness:   Ready   DIETARY INTAKE:  Usual eating pattern includes 3 meals and 1 snacks per day.  Everyday foods include cereal.  Enjoys tuna fish Avoided foods include lactose.    24-hr recall:  B ( AM): fruity pebbles cereal, fruit OR left overs (whatever her mom cooks) Snk ( AM): chips  L ( PM): fast food - chicken sandwich or chicken nuggets with dipping sauce, fries, juice or water Snk ( PM): none D ( PM): cereal  Snk ( PM):  Beverages: water, juice  Usual physical activity: ADLs  Estimated energy needs: 2400 calories*  *per CDC guidelines EER = 1946 calories + 400 cal 2nd trimester which includes additional calories due to underweight prior to pregnancy . 3rd trimesters needs are EER + 400-600 kcal.  Progress Towards Goal(s):  New goals .   Nutritional Diagnosis:  Fuig-3.1 Underweight As related to inadequate intake.  As evidenced by  dietary recall and BMI 18.76 at [redacted]w[redacted]d gestation.    Intervention:  Nutrition Education topics: Food groups to include for a healthy pregnancy Foods to include and strategies to increase caloric intake Assess nutritional supplements Iron content of foods  Plan: Consider adding in a high protein/high calorie nutritional supplement. If not gaining enough weight, can consider trying Boost Plus for more calories. Switch to lactose-free whole milk Keep high calorie snacks with you such as nuts or trailmix Try Perfect Protein peanut butter protein bars Add butter, full-fat sour cream to foods Try tuna packed in oil (but only 2x/week) Return for follow-up in 2 weeks  Teaching Method Utilized:  Visual Auditory Hands on  Handouts given during visit include:  Iron MNT from AND  Eating for a Healthy Baby  Tips for gaining weight (AND)  Barriers to learning/adherence to lifestyle change: none  Demonstrated degree of understanding via:  Teach Back   Monitoring/Evaluation:  Dietary intake, exercise, and body weight in 2 week(s).

## 2019-07-23 NOTE — Patient Instructions (Addendum)
Consider adding in a high protein/high calorie nutritional supplement. If not gaining enough weight, can consider trying Boost Plus for more calories. Switch to lactose-free whole milk Keep high calorie snacks with you such as nuts or trailmix Try Perfect Protein peanut butter protein bars Add butter, full-fat sour cream to foods Try tuna packed in oil (but only 2x/week) Return for follow-up in 2 weeks

## 2019-07-28 ENCOUNTER — Other Ambulatory Visit: Payer: Self-pay

## 2019-07-29 ENCOUNTER — Other Ambulatory Visit: Payer: Self-pay

## 2019-07-29 ENCOUNTER — Other Ambulatory Visit: Payer: Self-pay | Admitting: *Deleted

## 2019-07-29 ENCOUNTER — Ambulatory Visit: Payer: Medicaid Other

## 2019-07-29 DIAGNOSIS — Z3490 Encounter for supervision of normal pregnancy, unspecified, unspecified trimester: Secondary | ICD-10-CM | POA: Diagnosis present

## 2019-07-29 DIAGNOSIS — Z362 Encounter for other antenatal screening follow-up: Secondary | ICD-10-CM

## 2019-07-29 DIAGNOSIS — Z3A15 15 weeks gestation of pregnancy: Secondary | ICD-10-CM

## 2019-07-29 DIAGNOSIS — Z3687 Encounter for antenatal screening for uncertain dates: Secondary | ICD-10-CM | POA: Diagnosis not present

## 2019-07-30 DIAGNOSIS — Z713 Dietary counseling and surveillance: Secondary | ICD-10-CM | POA: Insufficient documentation

## 2019-08-05 ENCOUNTER — Encounter: Payer: Self-pay | Admitting: Certified Nurse Midwife

## 2019-08-05 ENCOUNTER — Telehealth (INDEPENDENT_AMBULATORY_CARE_PROVIDER_SITE_OTHER): Payer: 59 | Admitting: Certified Nurse Midwife

## 2019-08-05 DIAGNOSIS — O98819 Other maternal infectious and parasitic diseases complicating pregnancy, unspecified trimester: Secondary | ICD-10-CM

## 2019-08-05 DIAGNOSIS — Z3A16 16 weeks gestation of pregnancy: Secondary | ICD-10-CM

## 2019-08-05 DIAGNOSIS — O98312 Other infections with a predominantly sexual mode of transmission complicating pregnancy, second trimester: Secondary | ICD-10-CM

## 2019-08-05 DIAGNOSIS — Z3400 Encounter for supervision of normal first pregnancy, unspecified trimester: Secondary | ICD-10-CM

## 2019-08-05 DIAGNOSIS — Z283 Underimmunization status: Secondary | ICD-10-CM

## 2019-08-05 DIAGNOSIS — Z2839 Other underimmunization status: Secondary | ICD-10-CM

## 2019-08-05 DIAGNOSIS — O99891 Other specified diseases and conditions complicating pregnancy: Secondary | ICD-10-CM

## 2019-08-05 DIAGNOSIS — A749 Chlamydial infection, unspecified: Secondary | ICD-10-CM
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
## 2019-08-05 NOTE — Progress Notes (Signed)
Pt is able to take BP today.

## 2019-08-05 NOTE — Patient Instructions (Signed)
Alpha-Fetoprotein Test Why am I having this test? The alpha-fetoprotein test is most commonly used in pregnant women to help screen for birth defects in their unborn baby. It can be used to screen for birth defects, such as chromosome (DNA) abnormalities, problems with the brain or spinal cord, or problems with the abdominal wall of the unborn baby (fetus). The alpha-fetoprotein test may also be done for men or non-pregnant women to check for certain cancers. What is being tested? This test measures the amount of alpha-fetoprotein (AFP) in your blood. AFP is a protein that is made by the liver. Levels can be detected in the mother's blood during pregnancy, starting at 10 weeks and peaking at 16-18 weeks of the pregnancy. Abnormal levels can sometimes be a sign of a birth defect in the baby. Certain cancers can cause a high level of AFP in men and non-pregnant women. What kind of sample is taken?  A blood sample is required for this test. It is usually collected by inserting a needle into a blood vessel. How are the results reported? Your test results will be reported as values. Your health care provider will compare your results to normal ranges that were established after testing a large group of people (reference values). Reference values may vary among labs and hospitals. For this test, common reference values are:  Adult: Less than 40 ng/mL or less than 40 mcg/L (SI units).  Child younger than 1 year: Less than 30 ng/mL. If you are pregnant, the values may also vary based on how long you have been pregnant. What do the results mean? Results that are above the reference values in pregnant women may indicate the following for the baby:  Neural tube defects, such as abnormalities of the spinal cord or brain.  Abdominal wall defects.  Multiple pregnancy such as twins.  Fetal distress or fetal death. Results that are above the reference values in men or non-pregnant women may  indicate:  Reproductive cancers, such as ovarian or testicular cancer.  Liver cancer.  Liver cell death.  Other types of cancer. Very low levels of AFP in pregnant women may indicate the following for the baby:  Down syndrome.  Fetal death. Talk with your health care provider about what your results mean. Questions to ask your health care provider Ask your health care provider, or the department that is doing the test:  When will my results be ready?  How will I get my results?  What are my treatment options?  What other tests do I need?  What are my next steps? Summary  The alpha-fetoprotein test is done on pregnant women to help screen for birth defects in their unborn baby.  Certain cancers can cause a high level of AFP in men and non-pregnant women.  For this test, a blood sample is usually collected by inserting a needle into a blood vessel.  Talk with your health care provider about what your results mean. This information is not intended to replace advice given to you by your health care provider. Make sure you discuss any questions you have with your health care provider. Document Revised: 02/15/2017 Document Reviewed: 10/09/2016 Elsevier Patient Education  2020 Elsevier Inc.  

## 2019-08-06 ENCOUNTER — Other Ambulatory Visit: Payer: 59

## 2019-08-26 ENCOUNTER — Ambulatory Visit: Payer: Medicaid Other | Attending: Obstetrics and Gynecology

## 2019-08-26 ENCOUNTER — Other Ambulatory Visit: Payer: Self-pay

## 2019-08-26 DIAGNOSIS — Z3A19 19 weeks gestation of pregnancy: Secondary | ICD-10-CM | POA: Diagnosis not present

## 2019-08-26 DIAGNOSIS — Z362 Encounter for other antenatal screening follow-up: Secondary | ICD-10-CM | POA: Diagnosis present

## 2019-09-02 ENCOUNTER — Ambulatory Visit (INDEPENDENT_AMBULATORY_CARE_PROVIDER_SITE_OTHER): Payer: 59 | Admitting: Certified Nurse Midwife

## 2019-09-02 ENCOUNTER — Other Ambulatory Visit: Payer: Self-pay

## 2019-09-02 ENCOUNTER — Encounter: Payer: Self-pay | Admitting: Certified Nurse Midwife

## 2019-09-02 VITALS — BP 114/59 | HR 109 | Wt 101.2 lb

## 2019-09-02 DIAGNOSIS — Z3A20 20 weeks gestation of pregnancy: Secondary | ICD-10-CM

## 2019-09-02 DIAGNOSIS — Z3400 Encounter for supervision of normal first pregnancy, unspecified trimester: Secondary | ICD-10-CM

## 2019-09-02 DIAGNOSIS — Z2839 Other underimmunization status: Secondary | ICD-10-CM

## 2019-09-02 DIAGNOSIS — Z283 Underimmunization status: Secondary | ICD-10-CM

## 2019-09-02 DIAGNOSIS — Z3402 Encounter for supervision of normal first pregnancy, second trimester: Secondary | ICD-10-CM

## 2019-09-02 DIAGNOSIS — O99891 Other specified diseases and conditions complicating pregnancy: Secondary | ICD-10-CM

## 2019-09-02 NOTE — Progress Notes (Signed)
Patient presents for ROB. Patient has no concerns today. 

## 2019-09-02 NOTE — Patient Instructions (Signed)
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
## 2019-09-04 ENCOUNTER — Other Ambulatory Visit: Payer: Self-pay

## 2019-09-04 ENCOUNTER — Encounter (HOSPITAL_COMMUNITY): Payer: Self-pay | Admitting: Obstetrics and Gynecology

## 2019-09-04 ENCOUNTER — Inpatient Hospital Stay (HOSPITAL_COMMUNITY)
Admission: AD | Admit: 2019-09-04 | Discharge: 2019-09-05 | Disposition: A | Discharge status: Home / Self Care | Payer: Medicaid Other | Attending: Obstetrics and Gynecology | Admitting: Obstetrics and Gynecology

## 2019-09-04 DIAGNOSIS — Z3A20 20 weeks gestation of pregnancy: Secondary | ICD-10-CM

## 2019-09-04 DIAGNOSIS — J45909 Unspecified asthma, uncomplicated: Secondary | ICD-10-CM | POA: Insufficient documentation

## 2019-09-04 DIAGNOSIS — O26892 Other specified pregnancy related conditions, second trimester: Secondary | ICD-10-CM | POA: Diagnosis not present

## 2019-09-04 DIAGNOSIS — R102 Pelvic and perineal pain: Secondary | ICD-10-CM | POA: Insufficient documentation

## 2019-09-04 DIAGNOSIS — N949 Unspecified condition associated with female genital organs and menstrual cycle: Secondary | ICD-10-CM

## 2019-09-04 DIAGNOSIS — O99512 Diseases of the respiratory system complicating pregnancy, second trimester: Secondary | ICD-10-CM | POA: Insufficient documentation

## 2019-09-04 LAB — WET PREP, GENITAL
Sperm: NONE SEEN
Trich, Wet Prep: NONE SEEN
Yeast Wet Prep HPF POC: NONE SEEN

## 2019-09-04 LAB — URINALYSIS, ROUTINE W REFLEX MICROSCOPIC
Bilirubin Urine: NEGATIVE
Glucose, UA: NEGATIVE mg/dL
Hgb urine dipstick: NEGATIVE
Ketones, ur: NEGATIVE mg/dL
Nitrite: NEGATIVE
Protein, ur: NEGATIVE mg/dL
Specific Gravity, Urine: 1.018 (ref 1.005–1.030)
pH: 6 (ref 5.0–8.0)

## 2019-09-04 MED ORDER — ACETAMINOPHEN 500 MG PO TABS
1000.0000 mg | ORAL_TABLET | Freq: Four times a day (QID) | ORAL | Status: DC | PRN
  Administered 2019-09-04: 1000 mg via ORAL
  Filled 2019-09-04: qty 2

## 2019-09-04 NOTE — MAU Note (Addendum)
Pt reports to MAU via EMS c/o lower abdominal pain that is a 6/10 on the pain scale. Pt reports it is a constant pain. Pt denies bleeding or LOF. +FM. No other complaints.

## 2019-09-04 NOTE — Discharge Instructions (Signed)

## 2019-09-04 NOTE — MAU Provider Note (Signed)
History     CSN: [id number]  Arrival date and time: 09/04/19 2159   First Provider Initiated Contact with Patient 09/04/19 2238      Chief Complaint  Patient presents with  . Abdominal Pain   19 y.o. G1 @20.2 wks presenting with LAP. Pain started 1 hr ago. Describes as alternating between sharp and cramping. Rates 6/10. Has not taken anything for it. Denies pushing, pulling, or lifting activities. Denies VB, vaginal discharge, itching, or malodor. Denies urinary sx. +FM.   OB History    Gravida  1   Para      Term      Preterm      AB      Living  0     SAB      TAB      Ectopic      Multiple      Live Births              Past Medical History:  Diagnosis Date  . Asthma     Past Surgical History:  Procedure Laterality Date  . NO PAST SURGERIES      Family History  Problem Relation Age of Onset  . Healthy Mother   . Asthma Mother   . Healthy Father   . Asthma Brother   . Diabetes Paternal Grandmother     Social History   Tobacco Use  . Smoking status: Never Smoker  . Smokeless tobacco: Never Used  Vaping Use  . Vaping Use: Never used  Substance Use Topics  . Alcohol use: No  . Drug use: Never    Allergies: No Known Allergies  No medications prior to admission.    Review of Systems  Gastrointestinal: Positive for abdominal pain. Negative for constipation, diarrhea, nausea and vomiting.  Genitourinary: Negative for dysuria, hematuria, urgency, vaginal bleeding and vaginal discharge.   Physical Exam   Blood pressure 117/65, pulse 79, temperature 98 F (36.7 C), last menstrual period 04/05/2019.  Physical Exam  Nursing note and vitals reviewed. Constitutional: She is oriented to person, place, and time. No distress (appears comfortable).  HENT:  Head: Normocephalic and atraumatic.  Cardiovascular: Normal rate.  Respiratory: Effort normal. No respiratory distress.  GI: Soft. She exhibits no distension. There is abdominal  tenderness in the suprapubic area.  Genitourinary:    Genitourinary Comments: VE: closed/long   Neurological: She is alert and oriented to person, place, and time.  Skin: Skin is warm and dry.  Psychiatric: Mood normal.  FHT 135  Results for orders placed or performed during the hospital encounter of 09/04/19 (from the past 24 hour(s))  Urinalysis, Routine w reflex microscopic     Status: Abnormal   Collection Time: 09/04/19 10:46 PM  Result Value Ref Range   Color, Urine YELLOW YELLOW   APPearance CLOUDY (A) CLEAR   Specific Gravity, Urine 1.018 1.005 - 1.030   pH 6.0 5.0 - 8.0   Glucose, UA NEGATIVE NEGATIVE mg/dL   Hgb urine dipstick NEGATIVE NEGATIVE   Bilirubin Urine NEGATIVE NEGATIVE   Ketones, ur NEGATIVE NEGATIVE mg/dL   Protein, ur NEGATIVE NEGATIVE mg/dL   Nitrite NEGATIVE NEGATIVE   Leukocytes,Ua SMALL (A) NEGATIVE   WBC, UA 0-5 0 - 5 WBC/hpf   Bacteria, UA RARE (A) NONE SEEN   Squamous Epithelial / LPF 0-5 0 - 5   Mucus PRESENT   Wet prep, genital     Status: Abnormal   Collection Time: 09/04/19 10:46 PM   Specimen: Urine,   Clean Catch  Result Value Ref Range   Yeast Wet Prep HPF POC NONE SEEN NONE SEEN   Trich, Wet Prep NONE SEEN NONE SEEN   Clue Cells Wet Prep HPF POC PRESENT (A) NONE SEEN   WBC, Wet Prep HPF POC FEW (A) NONE SEEN   Sperm NONE SEEN    MAU Course  Procedures Tylenol  MDM Labs ordered and reviewed. No evidence of UTI or PTL. Pain improved after meds. Pain consistent with RL/MSK. Discussed comfort measures. Stable for discharge home.   Assessment and Plan   1. [redacted] weeks gestation of pregnancy   2. Round ligament pain    Discharge home Follow up at [facility] as scheduled PTL precautions  Allergies as of 09/05/2019   No Known Allergies     Medication List    TAKE these medications   Blood Pressure Kit Devi 1 kit by Does not apply route once a week. Check Blood Pressure regularly and record readings into the Babyscripts App.  Regular  Cuff.  DX O90.0   Prenatal Gummies/DHA & FA 0.4-32.5 MG Chew Chew by mouth.       Melanie Bhambri, CNM 09/05/2019, 12:22 AM  

## 2019-09-05 DIAGNOSIS — Z3A20 20 weeks gestation of pregnancy: Secondary | ICD-10-CM

## 2019-09-05 DIAGNOSIS — R102 Pelvic and perineal pain: Secondary | ICD-10-CM

## 2019-09-05 DIAGNOSIS — O26892 Other specified pregnancy related conditions, second trimester: Secondary | ICD-10-CM

## 2019-09-07 LAB — GC/CHLAMYDIA PROBE AMP (~~LOC~~) NOT AT ARMC
Chlamydia: NEGATIVE
Comment: NEGATIVE
Comment: NORMAL
Neisseria Gonorrhea: NEGATIVE

## 2019-09-15 ENCOUNTER — Telehealth: Payer: Self-pay

## 2019-09-15 LAB — AFP, SERUM, OPEN SPINA BIFIDA
AFP MoM: 0.57
AFP Value: 47.9 ng/mL
Gest. Age on Collection Date: 20 weeks
Maternal Age At EDD: 21.1 yr
OSBR Risk 1 IN: 10000
Test Results:: NEGATIVE
Weight: 101 [lb_av]

## 2019-09-15 NOTE — Telephone Encounter (Signed)
TC to [facility] to give missing AFP info.  Results should be back in 15-20 mins per [facility].

## 2019-09-30 ENCOUNTER — Telehealth (INDEPENDENT_AMBULATORY_CARE_PROVIDER_SITE_OTHER): Payer: Medicaid Other | Admitting: Obstetrics

## 2019-09-30 ENCOUNTER — Encounter: Payer: Self-pay | Admitting: Obstetrics

## 2019-09-30 DIAGNOSIS — Z3A24 24 weeks gestation of pregnancy: Secondary | ICD-10-CM | POA: Diagnosis not present

## 2019-09-30 DIAGNOSIS — Z3402 Encounter for supervision of normal first pregnancy, second trimester: Secondary | ICD-10-CM | POA: Diagnosis not present

## 2019-09-30 DIAGNOSIS — Z3400 Encounter for supervision of normal first pregnancy, unspecified trimester: Secondary | ICD-10-CM

## 2019-09-30 NOTE — Progress Notes (Signed)
S/w pt for virtual visit. Pt reports fetal movement, denies pain. 
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
## 2019-10-29 ENCOUNTER — Ambulatory Visit (INDEPENDENT_AMBULATORY_CARE_PROVIDER_SITE_OTHER): Payer: Medicaid Other | Admitting: Obstetrics

## 2019-10-29 ENCOUNTER — Encounter: Payer: Self-pay | Admitting: Obstetrics

## 2019-10-29 ENCOUNTER — Other Ambulatory Visit: Payer: Self-pay

## 2019-10-29 ENCOUNTER — Other Ambulatory Visit: Payer: 59

## 2019-10-29 VITALS — BP 118/65 | HR 92 | Wt 113.0 lb

## 2019-10-29 DIAGNOSIS — Z3400 Encounter for supervision of normal first pregnancy, unspecified trimester: Secondary | ICD-10-CM | POA: Diagnosis not present

## 2019-10-29 DIAGNOSIS — O98319 Other infections with a predominantly sexual mode of transmission complicating pregnancy, unspecified trimester: Secondary | ICD-10-CM

## 2019-10-29 DIAGNOSIS — A568 Sexually transmitted chlamydial infection of other sites: Secondary | ICD-10-CM

## 2019-10-29 DIAGNOSIS — Z23 Encounter for immunization: Secondary | ICD-10-CM | POA: Diagnosis not present
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
## 2019-10-29 NOTE — Progress Notes (Signed)
Pt presents for 2 gtt labs and Tdap Tdap given LD without difficulty

## 2019-10-30 LAB — GLUCOSE TOLERANCE, 2 HOURS W/ 1HR
Glucose, 1 hour: 124 mg/dL (ref 65–179)
Glucose, 2 hour: 103 mg/dL (ref 65–152)
Glucose, Fasting: 66 mg/dL (ref 65–91)

## 2019-10-30 LAB — HIV ANTIBODY (ROUTINE TESTING W REFLEX): HIV Screen 4th Generation wRfx: NONREACTIVE

## 2019-10-30 LAB — CBC
Hematocrit: 33.2 % — ABNORMAL LOW (ref 34.0–46.6)
Hemoglobin: 10.8 g/dL — ABNORMAL LOW (ref 11.1–15.9)
MCH: 31.7 pg (ref 26.6–33.0)
MCHC: 32.5 g/dL (ref 31.5–35.7)
MCV: 97 fL (ref 79–97)
Platelets: 143 10*3/uL — ABNORMAL LOW (ref 150–450)
RBC: 3.41 x10E6/uL — ABNORMAL LOW (ref 3.77–5.28)
RDW: 10.8 % — ABNORMAL LOW (ref 11.7–15.4)
WBC: 11.1 10*3/uL — ABNORMAL HIGH (ref 3.4–10.8)

## 2019-10-30 LAB — RPR: RPR Ser Ql: NONREACTIVE

## 2019-11-05 IMAGING — CR DG CHEST 2V
2 series · 2 of 2 positions shown · non-contrast
Comparison: None.

CLINICAL DATA: Left side chest pain, dizziness

EXAM:
CHEST - 2 VIEW

[chest pa]
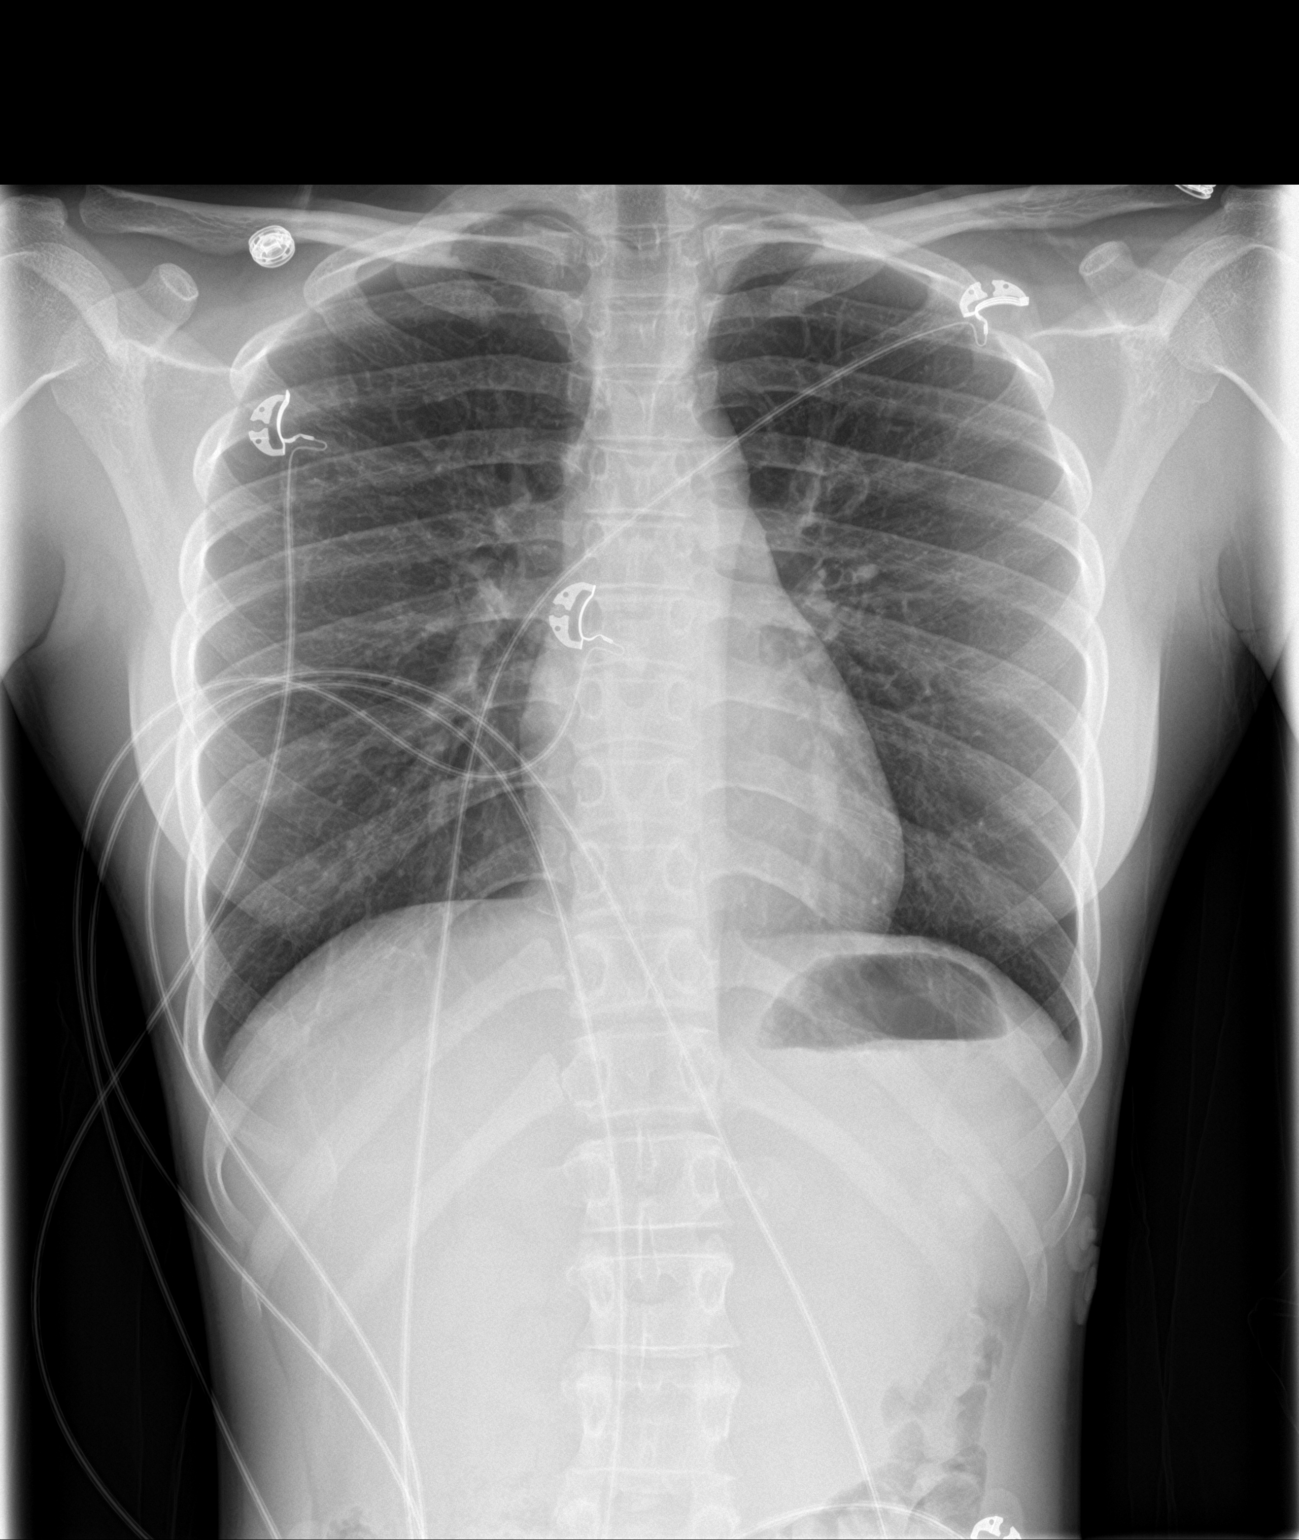

[chest lat]
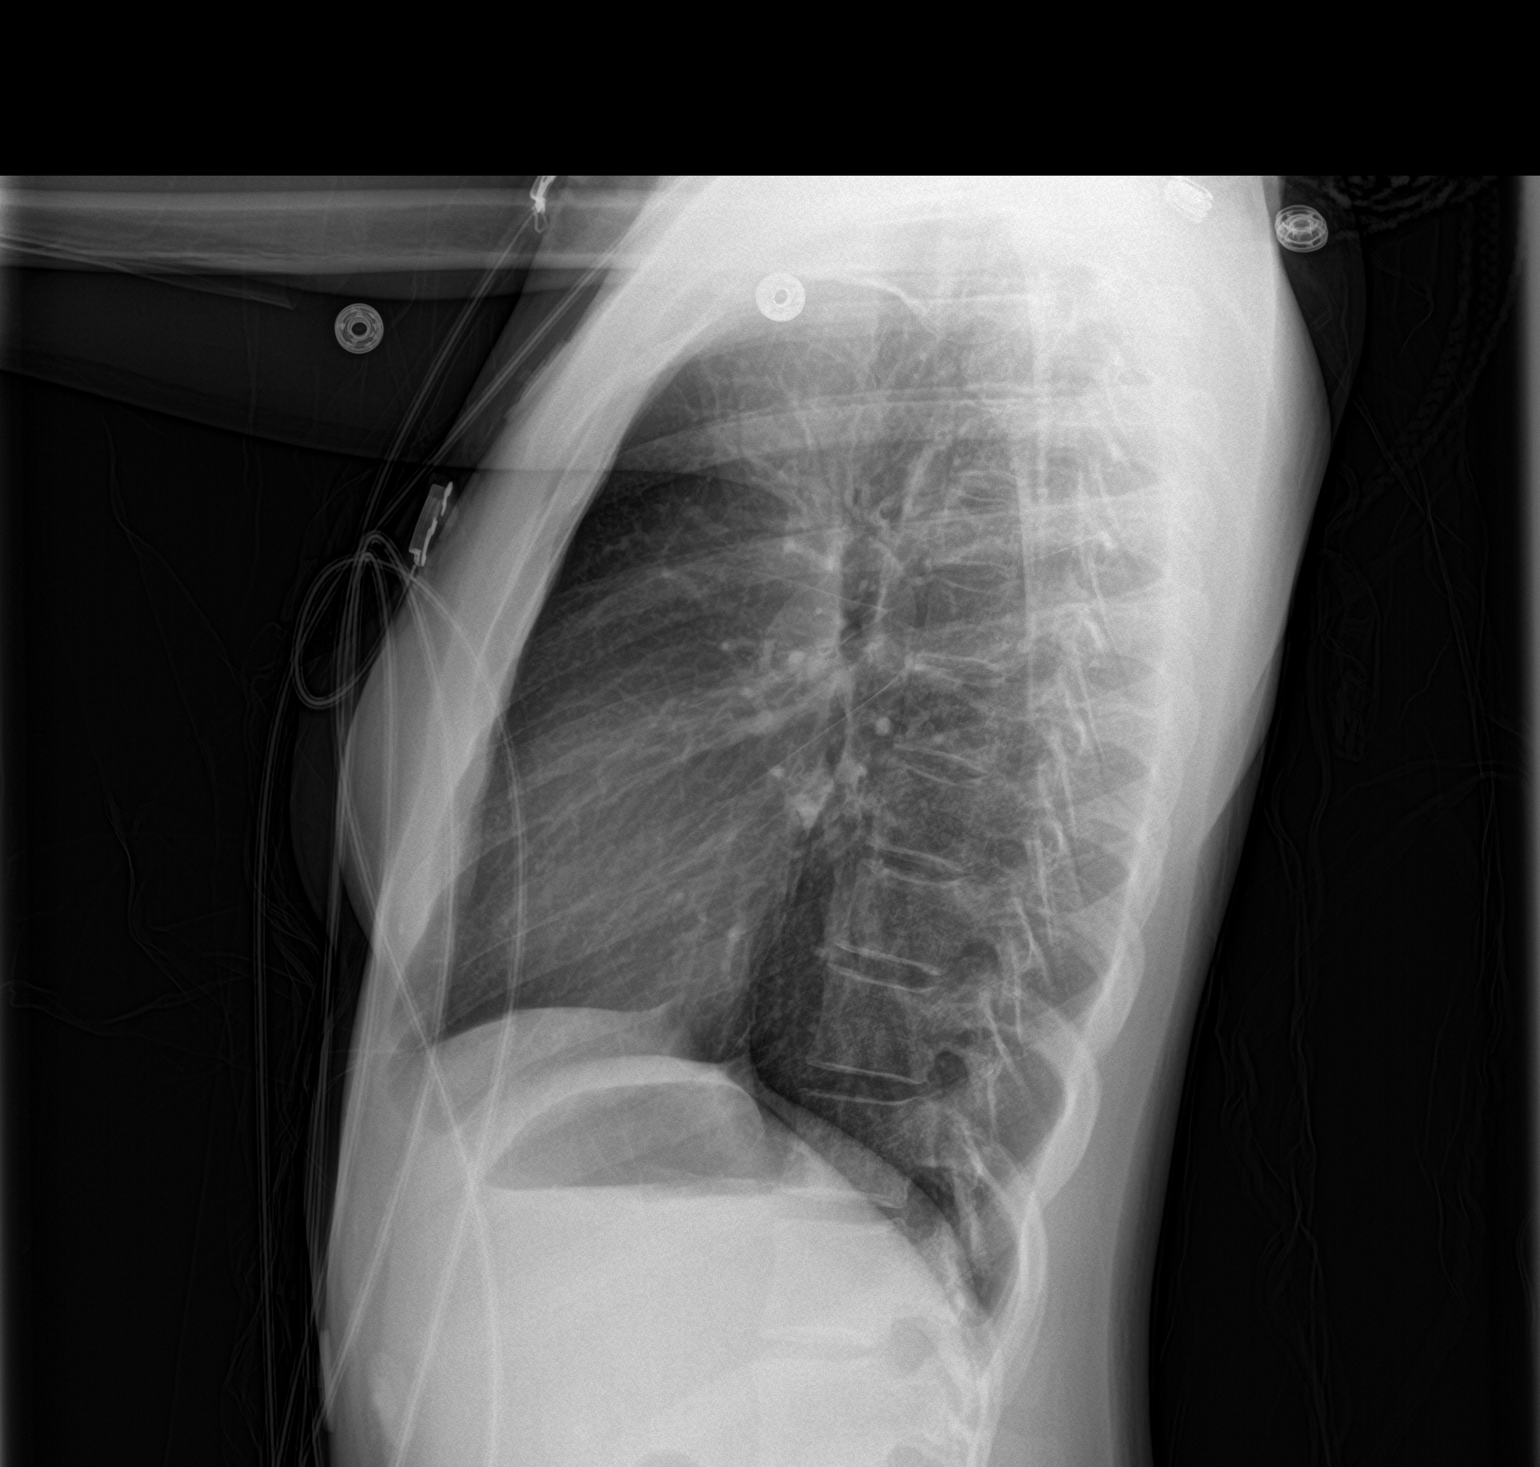

[2 of 2 positions shown; findings below may reference images not displayed]

FINDINGS: The heart size and mediastinal contours are within normal limits.
Both lungs are clear. The visualized skeletal structures are
unremarkable.
IMPRESSION: No active cardiopulmonary disease.

## 2019-11-12 ENCOUNTER — Encounter: Payer: Self-pay | Admitting: Women's Health

## 2019-11-12 ENCOUNTER — Telehealth (INDEPENDENT_AMBULATORY_CARE_PROVIDER_SITE_OTHER): Payer: 59 | Admitting: Women's Health

## 2019-11-12 VITALS — BP 106/68 | HR 99

## 2019-11-12 DIAGNOSIS — O98813 Other maternal infectious and parasitic diseases complicating pregnancy, third trimester: Secondary | ICD-10-CM

## 2019-11-12 DIAGNOSIS — Z283 Underimmunization status: Secondary | ICD-10-CM

## 2019-11-12 DIAGNOSIS — O09899 Supervision of other high risk pregnancies, unspecified trimester: Secondary | ICD-10-CM

## 2019-11-12 DIAGNOSIS — A749 Chlamydial infection, unspecified: Secondary | ICD-10-CM

## 2019-11-12 DIAGNOSIS — Z3A30 30 weeks gestation of pregnancy: Secondary | ICD-10-CM

## 2019-11-12 DIAGNOSIS — O98819 Other maternal infectious and parasitic diseases complicating pregnancy, unspecified trimester: Secondary | ICD-10-CM

## 2019-11-12 DIAGNOSIS — Z3400 Encounter for supervision of normal first pregnancy, unspecified trimester: Secondary | ICD-10-CM
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
## 2019-11-12 NOTE — Patient Instructions (Addendum)
Maternity Assessment Unit (MAU)  The Maternity Assessment Unit (MAU) is located at the [facility] [place] at [facility]. The address is: [address], [place], [place], [place] [address]. Please see map below for additional directions.    The Maternity Assessment Unit is designed to help you during your pregnancy, and for up to 6 weeks after delivery, with any pregnancy- or postpartum-related emergencies, if you think you are in labor, or if your water has broken. For example, if you experience nausea and vomiting, vaginal bleeding, severe abdominal or pelvic pain, elevated blood pressure or other problems related to your pregnancy or postpartum time, please come to the Maternity Assessment Unit for assistance.       Measles/Mumps/Rubella Vaccines, MMR injection What is this medicine? MEASLES VIRUS; MUMPS VIRUS; RUBELLA VIRUS VACCINE LIVE (MEE zuhlz VAHY ruhs; muhmps VAHY ruhs; roo bel uh VAHY ruhs vak SEEN [place] ) is used to prevent an infection with measles (rubeola), mumps, and rubella ([place] measles) viruses. It is used to prevent infection in children over 51 months old, adults that have not been vaccinated and are not pregnant, and anyone traveling to countries where there are high rates of measles, mumps, or rubella. This medicine may be used for other purposes; ask your health care provider or pharmacist if you have questions. COMMON BRAND NAME(S): M-M-R II What should I tell my health care provider before I take this medicine? They need to know if you have any of these conditions:  bleeding disorder  cancer including leukemia or lymphoma  immune system problems  infection with fever  low levels of platelets in the blood  recent blood transfusion or immune globulin infusion  seizure disorder  taking medicines for immunosuppression  an unusual or allergic reaction to vaccines, eggs, neomycin, gelatin, other medicines, foods, dyes, or  preservatives  pregnant or trying to get pregnant  breast-feeding How should I use this medicine? This vaccine is for injection under the skin. It is given by a health care professional. A copy of Vaccine Information Statements will be given before each vaccination. Read this sheet carefully each time. The sheet may change frequently. Talk to your pediatrician regarding the use of this medicine in children. While this drug may be prescribed for children as young as 13 months of age for selected conditions, precautions do apply. Overdosage: If you think you have taken too much of this medicine contact a poison control center or emergency room at once. NOTE: This medicine is only for you. Do not share this medicine with others. What if I miss a dose? Keep appointments for follow-up (booster) doses as directed. It is important not to miss your dose. Call your doctor or health care professional if you are unable to keep an appointment. What may interact with this medicine? Do not take this medicine with any of the following medications:  adalimumab  anakinra  etanercept  infliximab  medicines that suppress your immune system  medicines to treat cancer This medicine may also interact with the following medications:  immune globulins  live virus vaccines This list may not describe all possible interactions. Give your health care provider a list of all the medicines, herbs, non-prescription drugs, or dietary supplements you use. Also tell them if you smoke, drink alcohol, or use illegal drugs. Some items may interact with your medicine. What should I watch for while using this medicine? Visit your doctor for check-ups as directed. Do not become pregnant for 3 months after  receiving this vaccine. Women should inform their doctor if they wish to become pregnant or think they might be pregnant. There is a potential for serious side effects to an unborn child. Talk to your health care  professional or pharmacist for more information. What side effects may I notice from receiving this medicine? Side effects that you should report to your doctor or health care professional as soon as possible:  allergic reactions like skin rash, itching or hives, swelling of the face, lips, or tongue  breathing problems  changes in hearing  changes in vision  difficulty walking  extreme changes in behavior  fast, irregular heartbeat  fever over 100 degrees F  pain, tingling, numbness in the hands or feet  seizures  unusual bleeding or bruising  unusually weak or tired Side effects that usually do not require medical attention (report to your doctor or health care professional if they continue or are bothersome):  aches or pains  bruising, pain, swelling at site where injected  diarrhea  headache  low-grade fever of 100 degrees F or less  nausea, vomiting  runny nose, cough  sleepy  swollen glands This list may not describe all possible side effects. Call your doctor for medical advice about side effects. You may report side effects to FDA at 1-800-FDA-1088. Where should I keep my medicine? This drug is given in a hospital or clinic and will not be stored at home. NOTE: This sheet is a summary. It may not cover all possible information. If you have questions about this medicine, talk to your doctor, pharmacist, or health care provider.  2020 Elsevier/Gold Standard (2013-04-03 11:04:43)        Intrauterine Device Information An intrauterine device (IUD) is a medical device that is inserted in the uterus to prevent pregnancy. It is a small, T-shaped device that has one or two nylon strings hanging down from it. The strings hang out of the lower part of the uterus (cervix) to allow for future IUD removal. There are two types of IUDs available:  Hormone IUD. This type of IUD is made of plastic and contains the hormone progestin (synthetic progesterone). A  hormone IUD may last 3-5 years.  Copper IUD. This type of IUD has copper wire wrapped around it. A copper IUD may last up to 10 years. How is an IUD inserted? An IUD is inserted through the vagina and placed into the uterus with a minor medical procedure. The exact procedure for IUD insertion may vary among health care providers and hospitals. How does an IUD work? Synthetic progesterone in a hormonal IUD prevents pregnancy by:  Thickening cervical mucus to prevent sperm from entering the uterus.  Thinning the uterine lining to prevent a fertilized egg from being implanted there. Copper in a copper IUD prevents pregnancy by making the uterus and fallopian tubes produce a fluid that kills sperm. What are the advantages of an IUD? Advantages of either type of IUD  It is highly effective in preventing pregnancy.  It is reversible. You can become pregnant shortly after the IUD is removed.  It is low-maintenance and can stay in place for a long time.  There are no estrogen-related side effects.  It can be used when breastfeeding.  It is not associated with weight gain.  It can be inserted right after childbirth, an abortion, or a miscarriage. Advantages of a hormone IUD  If it is inserted within 7 days of your period starting, it works right after it is inserted.  If the hormone IUD is inserted at any other time in your cycle, you will need to use a backup method of birth control for 7 days after insertion.  It can make menstrual periods lighter.  It can reduce menstrual cramping.  It can be used for 3-5 years. Advantages of a copper IUD  It works right after it is inserted.  It can be used as a form of emergency birth control if it is inserted within 5 days after having unprotected sex.  It does not interfere with your body's natural hormones.  It can be used for 10 years. What are the disadvantages of an IUD?  An IUD may cause irregular menstrual bleeding for a period of  time after insertion.  You may have pain during insertion and have cramping and vaginal bleeding after insertion.  An IUD may cut the uterus (uterine perforation) when it is inserted. This is rare.  An IUD may cause pelvic inflammatory disease (PID), which is an infection in the uterus and fallopian tubes. This is rare, and it usually happens during the first 20 days after the IUD is inserted.  A copper IUD can make your menstrual flow heavier and more painful. How is an IUD removed?  You will lie on your back with your knees bent and your feet in footrests (stirrups).  A device will be inserted into your vagina to spread apart the vaginal walls (speculum). This will allow your health care provider to see the strings attached to the IUD.  Your health care provider will use a small instrument (forceps) to grasp the IUD strings and pull firmly until the IUD is removed. You may have some discomfort when the IUD is removed. Your health care provider may recommend taking over-the-counter pain relievers, such as ibuprofen, before the procedure. You may also have minor spotting for a few days after the procedure. The exact procedure for IUD removal may vary among health care providers and hospitals. Is the IUD right for me? Your health care provider will make sure you are a good candidate for an IUD and will discuss the advantages, disadvantages, and possible side effects with you. Summary  An intrauterine device (IUD) is a medical device that is inserted in the uterus to prevent pregnancy. It is a small, T-shaped device that has one or two nylon strings hanging down from it.  A hormone IUD contains the hormone progestin (synthetic progesterone). A copper IUD has copper wire wrapped around it.  Synthetic progesterone in a hormone IUD prevents pregnancy by thickening cervical mucus and thinning the walls of the uterus. Copper in a copper IUD prevents pregnancy by making the uterus and fallopian  tubes produce a fluid that kills sperm.  A hormone IUD can be left in place for 3-5 years. A copper IUD can be left in place for up to 10 years.  An IUD is inserted and removed by a health care provider. You may feel some pain during insertion and removal. Your health care provider may recommend taking over-the-counter pain medicine, such as ibuprofen, before an IUD procedure. This information is not intended to replace advice given to you by your health care provider. Make sure you discuss any questions you have with your health care provider. Document Revised: 02/15/2017 Document Reviewed: 04/03/2016 Elsevier Patient Education  [place].

## 2019-11-12 NOTE — Progress Notes (Signed)
Virtual ROB    CC:   Pt able to check B/P over the phone.   B/P:106/68 P:99

## 2019-11-26 ENCOUNTER — Telehealth (INDEPENDENT_AMBULATORY_CARE_PROVIDER_SITE_OTHER): Payer: 59

## 2019-11-26 VITALS — BP 89/68 | HR 84

## 2019-11-26 DIAGNOSIS — Z3400 Encounter for supervision of normal first pregnancy, unspecified trimester: Secondary | ICD-10-CM

## 2019-11-26 DIAGNOSIS — O26893 Other specified pregnancy related conditions, third trimester: Secondary | ICD-10-CM

## 2019-11-26 DIAGNOSIS — Z3A32 32 weeks gestation of pregnancy: Secondary | ICD-10-CM

## 2019-11-26 DIAGNOSIS — O26899 Other specified pregnancy related conditions, unspecified trimester: Secondary | ICD-10-CM

## 2019-11-26 DIAGNOSIS — R1030 Lower abdominal pain, unspecified: Secondary | ICD-10-CM
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
## 2019-11-26 NOTE — Progress Notes (Signed)
I connected with Michaela Gibson on 11/26/19 at  4:00 PM EDT by telephone and verified that I am speaking with the correct person using two identifiers.  Virtual ROB visit  Pt reports starting to leak colostrum in R breast only

## 2019-12-10 ENCOUNTER — Other Ambulatory Visit: Payer: Self-pay

## 2019-12-10 ENCOUNTER — Ambulatory Visit (INDEPENDENT_AMBULATORY_CARE_PROVIDER_SITE_OTHER): Payer: Medicaid Other | Admitting: Nurse Practitioner

## 2019-12-10 ENCOUNTER — Encounter: Payer: Self-pay | Admitting: Nurse Practitioner

## 2019-12-10 VITALS — BP 112/76 | HR 87 | Wt 113.0 lb

## 2019-12-10 DIAGNOSIS — Z3A34 34 weeks gestation of pregnancy: Secondary | ICD-10-CM

## 2019-12-10 DIAGNOSIS — Z3400 Encounter for supervision of normal first pregnancy, unspecified trimester: Secondary | ICD-10-CM
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
## 2019-12-10 NOTE — Progress Notes (Signed)
ROB, declined FLU vaccine.  C/o intermittent cramping x 1 week.

## 2019-12-14 ENCOUNTER — Inpatient Hospital Stay (HOSPITAL_COMMUNITY)
Admission: AD | Admit: 2019-12-14 | Discharge: 2019-12-14 | Disposition: A | Discharge status: Home / Self Care | Payer: Medicaid Other | Attending: Obstetrics and Gynecology | Admitting: Obstetrics and Gynecology

## 2019-12-14 ENCOUNTER — Encounter (HOSPITAL_COMMUNITY): Payer: Self-pay | Admitting: Obstetrics and Gynecology

## 2019-12-14 ENCOUNTER — Other Ambulatory Visit: Payer: Self-pay

## 2019-12-14 DIAGNOSIS — O98513 Other viral diseases complicating pregnancy, third trimester: Secondary | ICD-10-CM

## 2019-12-14 DIAGNOSIS — U071 COVID-19: Secondary | ICD-10-CM

## 2019-12-14 DIAGNOSIS — O4703 False labor before 37 completed weeks of gestation, third trimester: Secondary | ICD-10-CM | POA: Diagnosis not present

## 2019-12-14 DIAGNOSIS — Z3A34 34 weeks gestation of pregnancy: Secondary | ICD-10-CM

## 2019-12-14 LAB — WET PREP, GENITAL
Clue Cells Wet Prep HPF POC: NONE SEEN
Sperm: NONE SEEN
Trich, Wet Prep: NONE SEEN
Yeast Wet Prep HPF POC: NONE SEEN

## 2019-12-14 LAB — RESPIRATORY PANEL BY RT PCR (FLU A&B, COVID)
Influenza A by PCR: NEGATIVE
Influenza B by PCR: NEGATIVE
SARS Coronavirus 2 by RT PCR: POSITIVE — AB

## 2019-12-14 LAB — CBC
HCT: 33.1 % — ABNORMAL LOW (ref 36.0–46.0)
Hemoglobin: 10.5 g/dL — ABNORMAL LOW (ref 12.0–15.0)
MCH: 30.7 pg (ref 26.0–34.0)
MCHC: 31.7 g/dL (ref 30.0–36.0)
MCV: 96.8 fL (ref 80.0–100.0)
Platelets: 144 10*3/uL — ABNORMAL LOW (ref 150–400)
RBC: 3.42 MIL/uL — ABNORMAL LOW (ref 3.87–5.11)
RDW: 11.8 % (ref 11.5–15.5)
WBC: 8.2 10*3/uL (ref 4.0–10.5)
nRBC: 0 % (ref 0.0–0.2)

## 2019-12-14 LAB — URINALYSIS, ROUTINE W REFLEX MICROSCOPIC
Bilirubin Urine: NEGATIVE
Glucose, UA: NEGATIVE mg/dL
Ketones, ur: 20 mg/dL — AB
Nitrite: NEGATIVE
Protein, ur: NEGATIVE mg/dL
Specific Gravity, Urine: 1.017 (ref 1.005–1.030)
pH: 6 (ref 5.0–8.0)

## 2019-12-14 LAB — COMPREHENSIVE METABOLIC PANEL WITH GFR
ALT: 39 U/L (ref 0–44)
AST: 49 U/L — ABNORMAL HIGH (ref 15–41)
Albumin: 2.8 g/dL — ABNORMAL LOW (ref 3.5–5.0)
Alkaline Phosphatase: 128 U/L — ABNORMAL HIGH (ref 38–126)
Anion gap: 12 (ref 5–15)
BUN: 6 mg/dL (ref 6–20)
CO2: 22 mmol/L (ref 22–32)
Calcium: 8.6 mg/dL — ABNORMAL LOW (ref 8.9–10.3)
Chloride: 101 mmol/L (ref 98–111)
Creatinine, Ser: 0.65 mg/dL (ref 0.44–1.00)
GFR calc Af Amer: >60 mL/min
GFR calc non Af Amer: >60 mL/min
Glucose, Bld: 86 mg/dL (ref 70–99)
Potassium: 3.6 mmol/L (ref 3.5–5.1)
Sodium: 135 mmol/L (ref 135–145)
Total Bilirubin: 1.6 mg/dL — ABNORMAL HIGH (ref 0.3–1.2)
Total Protein: 6.2 g/dL — ABNORMAL LOW (ref 6.5–8.1)

## 2019-12-14 MED ORDER — METHYLPREDNISOLONE SODIUM SUCC 125 MG IJ SOLR: 125.0000 mg | Freq: Once | INTRAMUSCULAR | Status: DC | PRN

## 2019-12-14 MED ORDER — FAMOTIDINE IN NACL 20-0.9 MG/50ML-% IV SOLN: 20.0000 mg | Freq: Once | INTRAVENOUS | Status: DC | PRN

## 2019-12-14 MED ORDER — EPINEPHRINE 0.3 MG/0.3ML IJ SOAJ
0.3000 mg | Freq: Once | INTRAMUSCULAR | Status: DC | PRN
  Filled 2019-12-14: qty 0.6

## 2019-12-14 MED ORDER — SODIUM CHLORIDE 0.9 % IV SOLN
1200.0000 mg | Freq: Once | INTRAVENOUS | Status: AC
  Administered 2019-12-14: 1200 mg via INTRAVENOUS
  Filled 2019-12-14: qty 1200

## 2019-12-14 MED ORDER — DIPHENHYDRAMINE HCL 50 MG/ML IJ SOLN: 50.0000 mg | Freq: Once | INTRAMUSCULAR | Status: DC | PRN

## 2019-12-14 MED ORDER — SODIUM CHLORIDE 0.9 % IV SOLN
INTRAVENOUS | Status: DC | PRN
  Administered 2019-12-14: 1000 mL via INTRAVENOUS

## 2019-12-14 MED ORDER — ALBUTEROL SULFATE HFA 108 (90 BASE) MCG/ACT IN AERS
2.0000 | INHALATION_SPRAY | Freq: Once | RESPIRATORY_TRACT | Status: DC | PRN
  Filled 2019-12-14: qty 6.7

## 2019-12-14 NOTE — MAU Note (Signed)
Presents with c/o abdominal cramping x3 days, states feels like menstrual cramps.  Denies VB or LOF.  Endorses +FM.

## 2019-12-14 NOTE — MAU Provider Note (Addendum)
History     CSN: [id number]  Arrival date and time: 12/14/19 1317   None     Chief Complaint  Patient presents with  . Abdominal Pain   HPI: Michaela Gibson is a 19 y.o. G1P0 female at 43w5dthat presents to that MAU with complaints of intermittent abdominal cramping that she describes as similar to period cramps and stabbing pain. Pain is concentrated bilaterally in the suprapubic area. States the pain began on Friday when she developed cold-like symptoms. States she started having rhinorrhea, congestion, SOB, a cough and decreased appetite. Denies sick contacts. Denies changes in bowel habits, last BM yesterday. Hx of chlamydia infection, treated. Denies vaginal discharge, vaginal bleeding, or fluid loss. Endorses fetal movement.   OB History    Gravida  1   Para      Term      Preterm      AB      Living  0     SAB      TAB      Ectopic      Multiple      Live Births              Past Medical History:  Diagnosis Date  . Asthma     Past Surgical History:  Procedure Laterality Date  . NO PAST SURGERIES      Family History  Problem Relation Age of Onset  . Healthy Mother   . Asthma Mother   . Healthy Father   . Asthma Father   . Asthma Brother   . Diabetes Paternal Grandmother     Social History   Tobacco Use  . Smoking status: Never Smoker  . Smokeless tobacco: Never Used  Vaping Use  . Vaping Use: Never used  Substance Use Topics  . Alcohol use: No  . Drug use: Never    Allergies: No Known Allergies  Medications Prior to Admission  Medication Sig Dispense Refill Last Dose  . Prenatal MV-Min-FA-Omega-3 (PRENATAL GUMMIES/DHA & FA) 0.4-32.5 MG CHEW Chew by mouth.   12/13/2019 at Unknown time  . Blood Pressure Monitoring (BLOOD PRESSURE KIT) DEVI 1 kit by Does not apply route once a week. Check Blood Pressure regularly and record readings into the [employer] App.  Regular Cuff.  DX O90.0 1 each 0     Review of Systems  Constitutional:  Positive for appetite change and fatigue. Negative for fever.  HENT: Positive for congestion and rhinorrhea.   Respiratory: Positive for cough and shortness of breath. Negative for wheezing.   Cardiovascular: Negative.   Gastrointestinal: Positive for abdominal pain and nausea. Negative for constipation, diarrhea and vomiting.  Genitourinary: Positive for difficulty urinating, pelvic pain and urgency. Negative for vaginal bleeding and vaginal discharge.  All other systems reviewed and are negative.  Physical Exam   Blood pressure 108/61, pulse (!) 107, temperature 99.2 F (37.3 C), temperature source Oral, resp. rate 20, height '5\' 1"'  (1.549 m), weight 49.9 kg, last menstrual period 04/05/2019, SpO2 100 %.  Physical Exam Vitals and nursing note reviewed. Exam conducted with a chaperone present.  Constitutional:      General: She is not in acute distress.    Appearance: She is well-developed and normal weight. She is ill-appearing.  Cardiovascular:     Rate and Rhythm: Normal rate and regular rhythm.     Heart sounds: Normal heart sounds.  Pulmonary:     Effort: Pulmonary effort is normal.     Breath sounds: Normal breath  sounds. No wheezing.  Abdominal:     General: Bowel sounds are normal.     Tenderness: There is abdominal tenderness in the right lower quadrant, suprapubic area and left lower quadrant.  Genitourinary:    Vagina: Vaginal discharge (Thin, milky) present. No tenderness.  Skin:    General: Skin is warm and dry.     Capillary Refill: Capillary refill takes less than 2 seconds.     Coloration: Skin is not pale.  Neurological:     Mental Status: She is alert and oriented to person, place, and time.  Psychiatric:        Mood and Affect: Mood normal.        Behavior: Behavior normal.    Results for orders placed or performed during the hospital encounter of 12/14/19 (from the past 24 hour(s))  Urinalysis, Routine w reflex microscopic Urine, Clean Catch     Status:  Abnormal   Collection Time: 12/14/19  1:50 PM  Result Value Ref Range   Color, Urine AMBER (A) YELLOW   APPearance HAZY (A) CLEAR   Specific Gravity, Urine 1.017 1.005 - 1.030   pH 6.0 5.0 - 8.0   Glucose, UA NEGATIVE NEGATIVE mg/dL   Hgb urine dipstick SMALL (A) NEGATIVE   Bilirubin Urine NEGATIVE NEGATIVE   Ketones, ur 20 (A) NEGATIVE mg/dL   Protein, ur NEGATIVE NEGATIVE mg/dL   Nitrite NEGATIVE NEGATIVE   Leukocytes,Ua MODERATE (A) NEGATIVE   RBC / HPF 6-10 0 - 5 RBC/hpf   WBC, UA 0-5 0 - 5 WBC/hpf   Bacteria, UA RARE (A) NONE SEEN   Squamous Epithelial / LPF 0-5 0 - 5   Mucus PRESENT   Respiratory Panel by RT PCR (Flu A&B, Covid) - Nasopharyngeal Swab     Status: Abnormal   Collection Time: 12/14/19  2:40 PM   Specimen: Nasopharyngeal Swab  Result Value Ref Range   SARS Coronavirus 2 by RT PCR POSITIVE (A) NEGATIVE   Influenza A by PCR NEGATIVE NEGATIVE   Influenza B by PCR NEGATIVE NEGATIVE  CBC     Status: Abnormal   Collection Time: 12/14/19  2:44 PM  Result Value Ref Range   WBC 8.2 4.0 - 10.5 K/uL   RBC 3.42 (L) 3.87 - 5.11 MIL/uL   Hemoglobin 10.5 (L) 12.0 - 15.0 g/dL   HCT 33.1 (L) 36 - 46 %   MCV 96.8 80.0 - 100.0 fL   MCH 30.7 26.0 - 34.0 pg   MCHC 31.7 30.0 - 36.0 g/dL   RDW 11.8 11.5 - 15.5 %   Platelets 144 (L) 150 - 400 K/uL   nRBC 0.0 0.0 - 0.2 %  Comprehensive metabolic panel     Status: Abnormal   Collection Time: 12/14/19  2:44 PM  Result Value Ref Range   Sodium 135 135 - 145 mmol/L   Potassium 3.6 3.5 - 5.1 mmol/L   Chloride 101 98 - 111 mmol/L   CO2 22 22 - 32 mmol/L   Glucose, Bld 86 70 - 99 mg/dL   BUN 6 6 - 20 mg/dL   Creatinine, Ser 0.65 0.44 - 1.00 mg/dL   Calcium 8.6 (L) 8.9 - 10.3 mg/dL   Total Protein 6.2 (L) 6.5 - 8.1 g/dL   Albumin 2.8 (L) 3.5 - 5.0 g/dL   AST 49 (H) 15 - 41 U/L   ALT 39 0 - 44 U/L   Alkaline Phosphatase 128 (H) 38 - 126 U/L   Total Bilirubin 1.6 (H) 0.3 -  1.2 mg/dL   GFR calc non Af Amer >60 >60 mL/min   GFR  calc Af Amer >60 >60 mL/min   Anion gap 12 5 - 15  Wet prep, genital     Status: Abnormal   Collection Time: 12/14/19  5:25 PM   Specimen: Vaginal  Result Value Ref Range   Yeast Wet Prep HPF POC NONE SEEN NONE SEEN   Trich, Wet Prep NONE SEEN NONE SEEN   Clue Cells Wet Prep HPF POC NONE SEEN NONE SEEN   WBC, Wet Prep HPF POC MODERATE (A) NONE SEEN   Sperm NONE SEEN     MDM  - Respiratory panel - Covid +, mAB therapy given and tolerated well - all other labs and swabs normal - Contractions monitored and eased during MAU stay.  Fetal Tracing: reactive Baseline: 140 Variability: moderate Accelerations: present Decelerations: none Toco: UI  Assessment and Plan  Covid infection at [redacted]w[redacted]d Threatened preterm labor  Education given on expected course of covid infection Return and preterm labor precautions given Follow up at [facility] as scheduled   CLucina Mellow9/27/2021, 7:55 PM   Attestation of Supervision of Student:  I confirm that I have verified the information documented in the physician assistant student's note and that I have also personally performed the history, physical exam and all medical decision making activities.  I have verified that all services and findings are accurately documented in this student's note; and I agree with management and plan as outlined in the documentation. I have also made any necessary editorial changes.  JGabriel Carina [employer] 12/14/2019 8:20 PM

## 2019-12-14 NOTE — Discharge Instructions (Signed)
COVID-19 COVID-19 is a respiratory infection that is caused by a virus called severe acute respiratory syndrome coronavirus 2 (SARS-CoV-2). The disease is also known as coronavirus disease or novel coronavirus. In some people, the virus may not cause any symptoms. In others, it may cause a serious infection. The infection can get worse quickly and can lead to complications, such as:  Pneumonia, or infection of the lungs.  Acute respiratory distress syndrome or ARDS. This is a condition in which fluid build-up in the lungs prevents the lungs from filling with air and passing oxygen into the blood.  Acute respiratory failure. This is a condition in which there is not enough oxygen passing from the lungs to the body or when carbon dioxide is not passing from the lungs out of the body.  Sepsis or septic shock. This is a serious bodily reaction to an infection.  Blood clotting problems.  Secondary infections due to bacteria or fungus.  Organ failure. This is when your body's organs stop working. The virus that causes COVID-19 is contagious. This means that it can spread from person to person through droplets from coughs and sneezes (respiratory secretions). What are the causes? This illness is caused by a virus. You may catch the virus by:  Breathing in droplets from an infected person. Droplets can be spread by a person breathing, speaking, singing, coughing, or sneezing.  Touching something, like a table or a doorknob, that was exposed to the virus (contaminated) and then touching your mouth, nose, or eyes. What increases the risk? Risk for infection You are more likely to be infected with this virus if you:  Are within 6 feet (2 meters) of a person with COVID-19.  Provide care for or live with a person who is infected with COVID-19.  Spend time in crowded indoor spaces or live in shared housing. Risk for serious illness You are more likely to become seriously ill from the virus if  you:  Are 50 years of age or older. The higher your age, the more you are at risk for serious illness.  Live in a nursing home or long-term care facility.  Have cancer.  Have a long-term (chronic) disease such as: ? Chronic lung disease, including chronic obstructive pulmonary disease or asthma. ? A long-term disease that lowers your body's ability to fight infection (immunocompromised). ? Heart disease, including heart failure, a condition in which the arteries that lead to the heart become narrow or blocked (coronary artery disease), a disease which makes the heart muscle thick, weak, or stiff (cardiomyopathy). ? Diabetes. ? Chronic kidney disease. ? Sickle cell disease, a condition in which red blood cells have an abnormal "sickle" shape. ? Liver disease.  Are obese. What are the signs or symptoms? Symptoms of this condition can range from mild to severe. Symptoms may appear any time from 2 to 14 days after being exposed to the virus. They include:  A fever or chills.  A cough.  Difficulty breathing.  Headaches, body aches, or muscle aches.  Runny or stuffy (congested) nose.  A sore throat.  New loss of taste or smell. Some people may also have stomach problems, such as nausea, vomiting, or diarrhea. Other people may not have any symptoms of COVID-19. How is this diagnosed? This condition may be diagnosed based on:  Your signs and symptoms, especially if: ? You live in an area with a COVID-19 outbreak. ? You recently traveled to or from an area where the virus is common. ? You   provide care for or live with a person who was diagnosed with COVID-19. ? You were exposed to a person who was diagnosed with COVID-19.  A physical exam.  Lab tests, which may include: ? Taking a sample of fluid from the back of your nose and throat (nasopharyngeal fluid), your nose, or your throat using a swab. ? A sample of mucus from your lungs (sputum). ? Blood tests.  Imaging tests,  which may include, X-rays, CT scan, or ultrasound. How is this treated? At present, there is no medicine to treat COVID-19. Medicines that treat other diseases are being used on a trial basis to see if they are effective against COVID-19. Your health care provider will talk with you about ways to treat your symptoms. For most people, the infection is mild and can be managed at home with rest, fluids, and over-the-counter medicines. Treatment for a serious infection usually takes places in a hospital intensive care unit (ICU). It may include one or more of the following treatments. These treatments are given until your symptoms improve.  Receiving fluids and medicines through an IV.  Supplemental oxygen. Extra oxygen is given through a tube in the nose, a face mask, or a hood.  Positioning you to lie on your stomach (prone position). This makes it easier for oxygen to get into the lungs.  Continuous positive airway pressure (CPAP) or bi-level positive airway pressure (BPAP) machine. This treatment uses mild air pressure to keep the airways open. A tube that is connected to a motor delivers oxygen to the body.  Ventilator. This treatment moves air into and out of the lungs by using a tube that is placed in your windpipe.  Tracheostomy. This is a procedure to create a hole in the neck so that a breathing tube can be inserted.  Extracorporeal membrane oxygenation (ECMO). This procedure gives the lungs a chance to recover by taking over the functions of the heart and lungs. It supplies oxygen to the body and removes carbon dioxide. Follow these instructions at home: Lifestyle  If you are sick, stay home except to get medical care. Your health care provider will tell you how long to stay home. Call your health care provider before you go for medical care.  Rest at home as told by your health care provider.  Do not use any products that contain nicotine or tobacco, such as cigarettes,  e-cigarettes, and chewing tobacco. If you need help quitting, ask your health care provider.  Return to your normal activities as told by your health care provider. Ask your health care provider what activities are safe for you. General instructions  Take over-the-counter and prescription medicines only as told by your health care provider.  Drink enough fluid to keep your urine pale yellow.  Keep all follow-up visits as told by your health care provider. This is important. How is this prevented?  There is no vaccine to help prevent COVID-19 infection. However, there are steps you can take to protect yourself and others from this virus. To protect yourself:   Do not travel to areas where COVID-19 is a risk. The areas where COVID-19 is reported change often. To identify high-risk areas and travel restrictions, check the CDC travel website: wwwnc.cdc.gov/travel/notices  If you live in, or must travel to, an area where COVID-19 is a risk, take precautions to avoid infection. ? Stay away from people who are sick. ? Wash your hands often with soap and water for 20 seconds. If soap and water   are not available, use an alcohol-based hand sanitizer. ? Avoid touching your mouth, face, eyes, or nose. ? Avoid going out in public, follow guidance from your state and local health authorities. ? If you must go out in public, wear a cloth face covering or face mask. Make sure your mask covers your nose and mouth. ? Avoid crowded indoor spaces. Stay at least 6 feet (2 meters) away from others. ? Disinfect objects and surfaces that are frequently touched every day. This may include:  Counters and tables.  Doorknobs and light switches.  Sinks and faucets.  Electronics, such as phones, remote controls, keyboards, computers, and tablets. To protect others: If you have symptoms of COVID-19, take steps to prevent the virus from spreading to others.  If you think you have a COVID-19 infection, contact  your health care provider right away. Tell your health care team that you think you may have a COVID-19 infection.  Stay home. Leave your house only to seek medical care. Do not use public transport.  Do not travel while you are sick.  Wash your hands often with soap and water for 20 seconds. If soap and water are not available, use alcohol-based hand sanitizer.  Stay away from other members of your household. Let healthy household members care for children and pets, if possible. If you have to care for children or pets, wash your hands often and wear a mask. If possible, stay in your own room, separate from others. Use a different bathroom.  Make sure that all people in your household wash their hands well and often.  Cough or sneeze into a tissue or your sleeve or elbow. Do not cough or sneeze into your hand or into the air.  Wear a cloth face covering or face mask. Make sure your mask covers your nose and mouth. Where to find more information  Centers for Disease Control and Prevention: www.cdc.gov/coronavirus/2019-ncov/index.html  World Health Organization: www.who.int/health-topics/coronavirus Contact a health care provider if:  You live in or have traveled to an area where COVID-19 is a risk and you have symptoms of the infection.  You have had contact with someone who has COVID-19 and you have symptoms of the infection. Get help right away if:  You have trouble breathing.  You have pain or pressure in your chest.  You have confusion.  You have bluish lips and fingernails.  You have difficulty waking from sleep.  You have symptoms that get worse. These symptoms may represent a serious problem that is an emergency. Do not wait to see if the symptoms will go away. Get medical help right away. Call your local emergency services (911 in the [place] Do not drive yourself to the hospital. Let the emergency medical personnel know if you think you have  COVID-19. Summary  COVID-19 is a respiratory infection that is caused by a virus. It is also known as coronavirus disease or novel coronavirus. It can cause serious infections, such as pneumonia, acute respiratory distress syndrome, acute respiratory failure, or sepsis.  The virus that causes COVID-19 is contagious. This means that it can spread from person to person through droplets from breathing, speaking, singing, coughing, or sneezing.  You are more likely to develop a serious illness if you are 50 years of age or older, have a weak immune system, live in a nursing home, or have chronic disease.  There is no medicine to treat COVID-19. Your health care provider will talk with you about ways to treat your symptoms.    Take steps to protect yourself and others from infection. Wash your hands often and disinfect objects and surfaces that are frequently touched every day. Stay away from people who are sick and wear a mask if you are sick. This information is not intended to replace advice given to you by your health care provider. Make sure you discuss any questions you have with your health care provider. Document Revised: 01/02/2019 Document Reviewed: 04/10/2018 Elsevier Patient Education  2020 [employer] Inc.  COVID-19: Quarantine vs. Isolation QUARANTINE keeps someone who was in close contact with someone who has COVID-19 away from others. If you had close contact with a person who has COVID-19  Stay home until 14 days after your last contact.  Check your temperature twice a day and watch for symptoms of COVID-19.  If possible, stay away from people who are at higher-risk for getting very sick from COVID-19. ISOLATION keeps someone who is sick or tested positive for COVID-19 without symptoms away from others, even in their own home. If you are sick and think or know you have COVID-19  Stay home until after ? At least 10 days since symptoms first appeared and ? At least 24 hours with  no fever without fever-reducing medication and ? Symptoms have improved If you tested positive for COVID-19 but do not have symptoms  Stay home until after ? 10 days have passed since your positive test If you live with others, stay in a specific "sick room" or area and away from other people or animals, including pets. Use a separate bathroom, if available. cdc.gov/coronavirus 10/06/2018 This information is not intended to replace advice given to you by your health care provider. Make sure you discuss any questions you have with your health care provider. Document Revised: 02/19/2019 Document Reviewed: 02/19/2019 Elsevier Patient Education  2020 Elsevier Inc.  

## 2019-12-15 LAB — GC/CHLAMYDIA PROBE AMP (~~LOC~~) NOT AT ARMC
Chlamydia: NEGATIVE
Comment: NEGATIVE
Comment: NORMAL
Neisseria Gonorrhea: NEGATIVE

## 2019-12-19 IMAGING — DX DG KNEE COMPLETE 4+V*L*
4 series · 4 of 4 positions shown · non-contrast
Comparison: No prior.

CLINICAL DATA: Increased knee pain with flexion.

EXAM:
LEFT KNEE - COMPLETE 4+ VIEW

[knee ap]
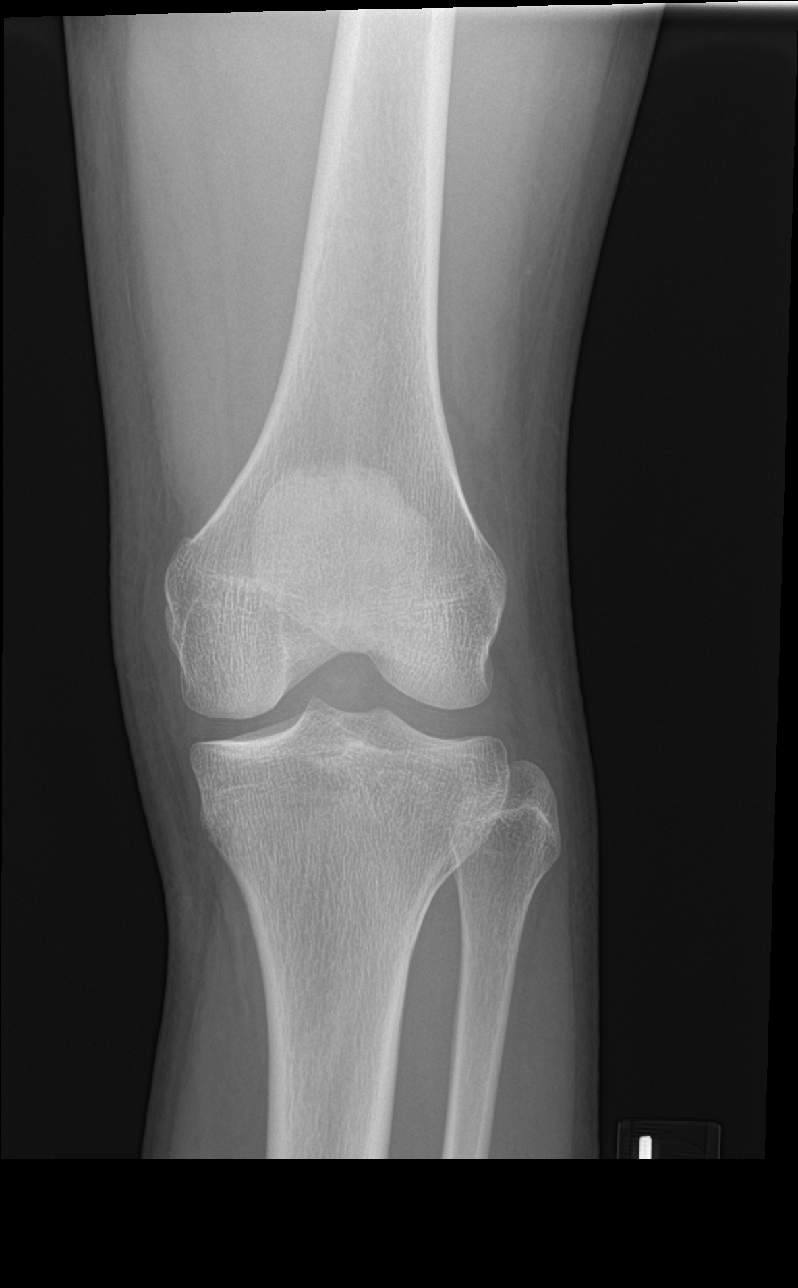

[knee tunnel]
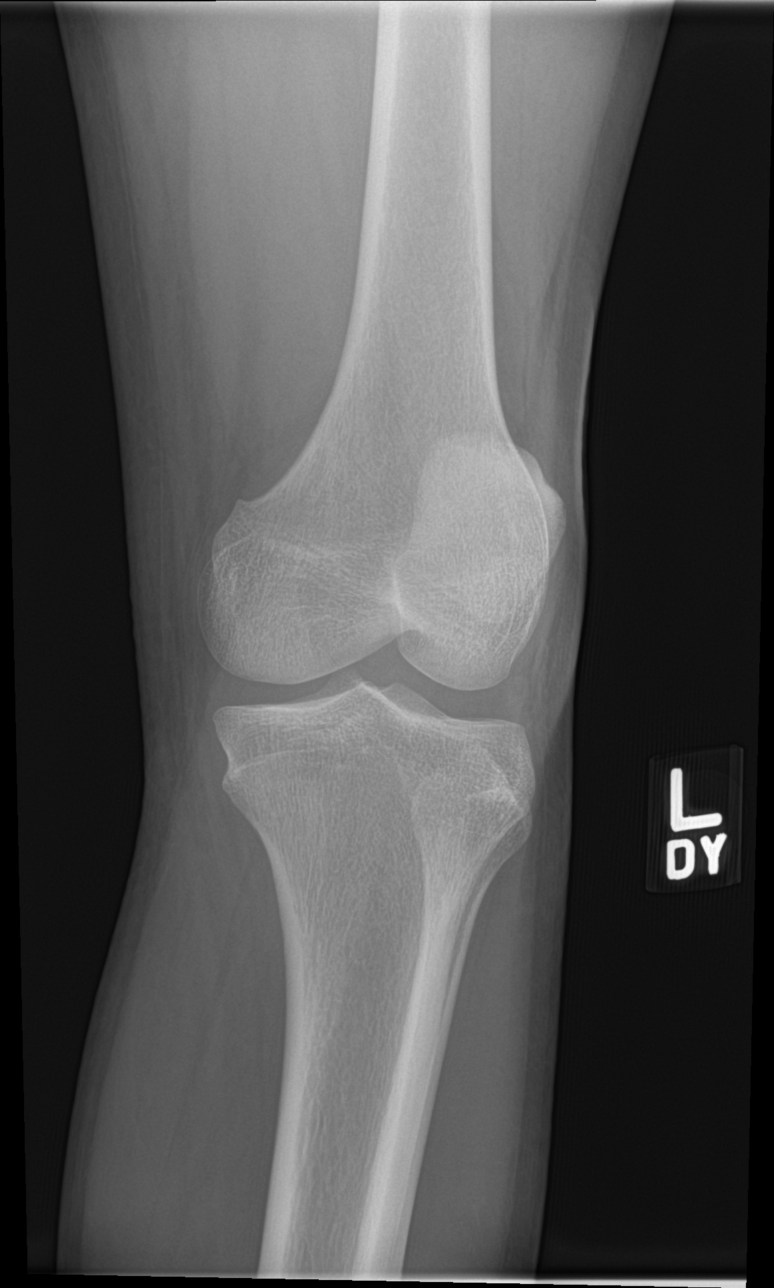

[patella skyline]
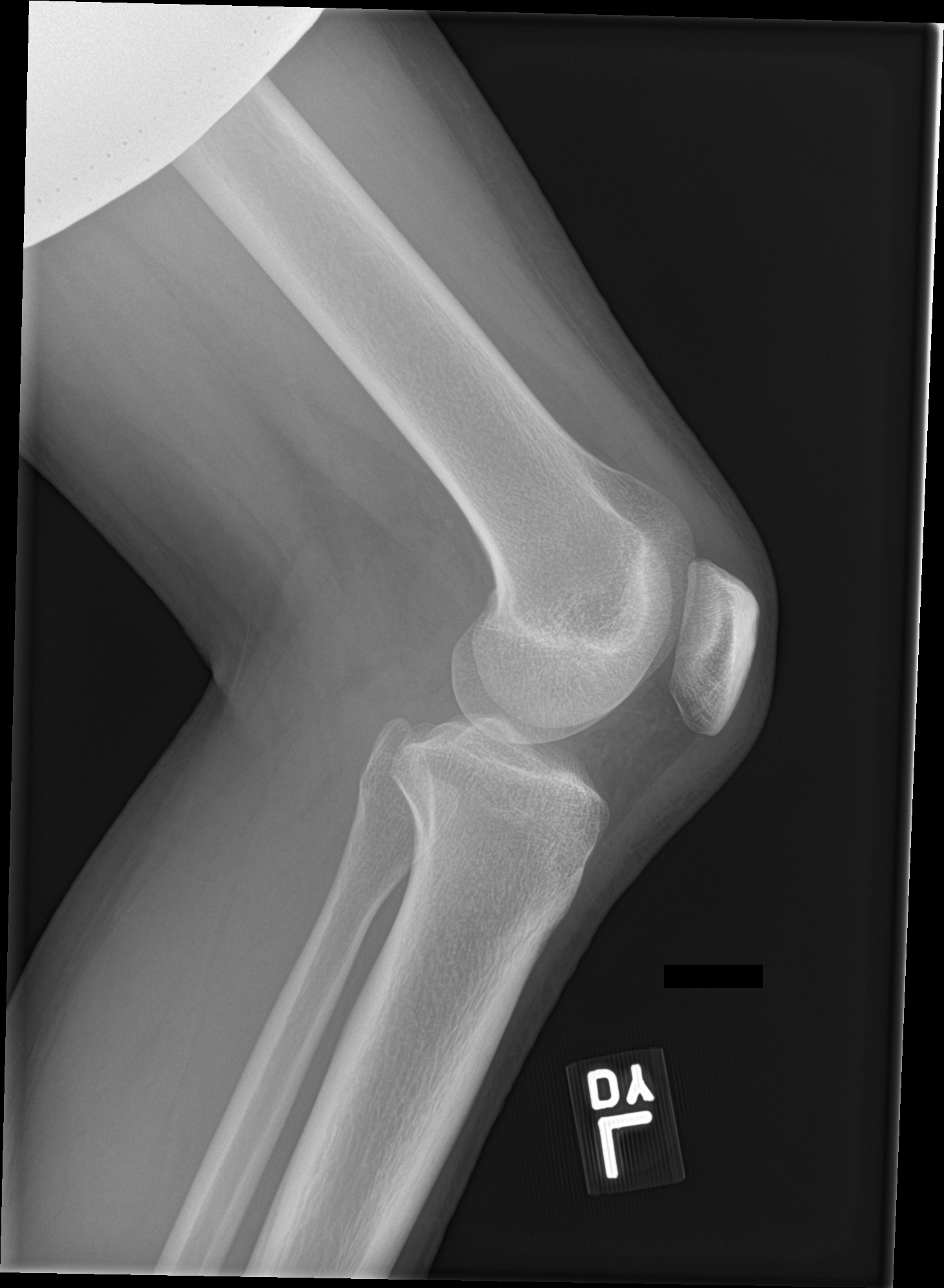

[knee obl]
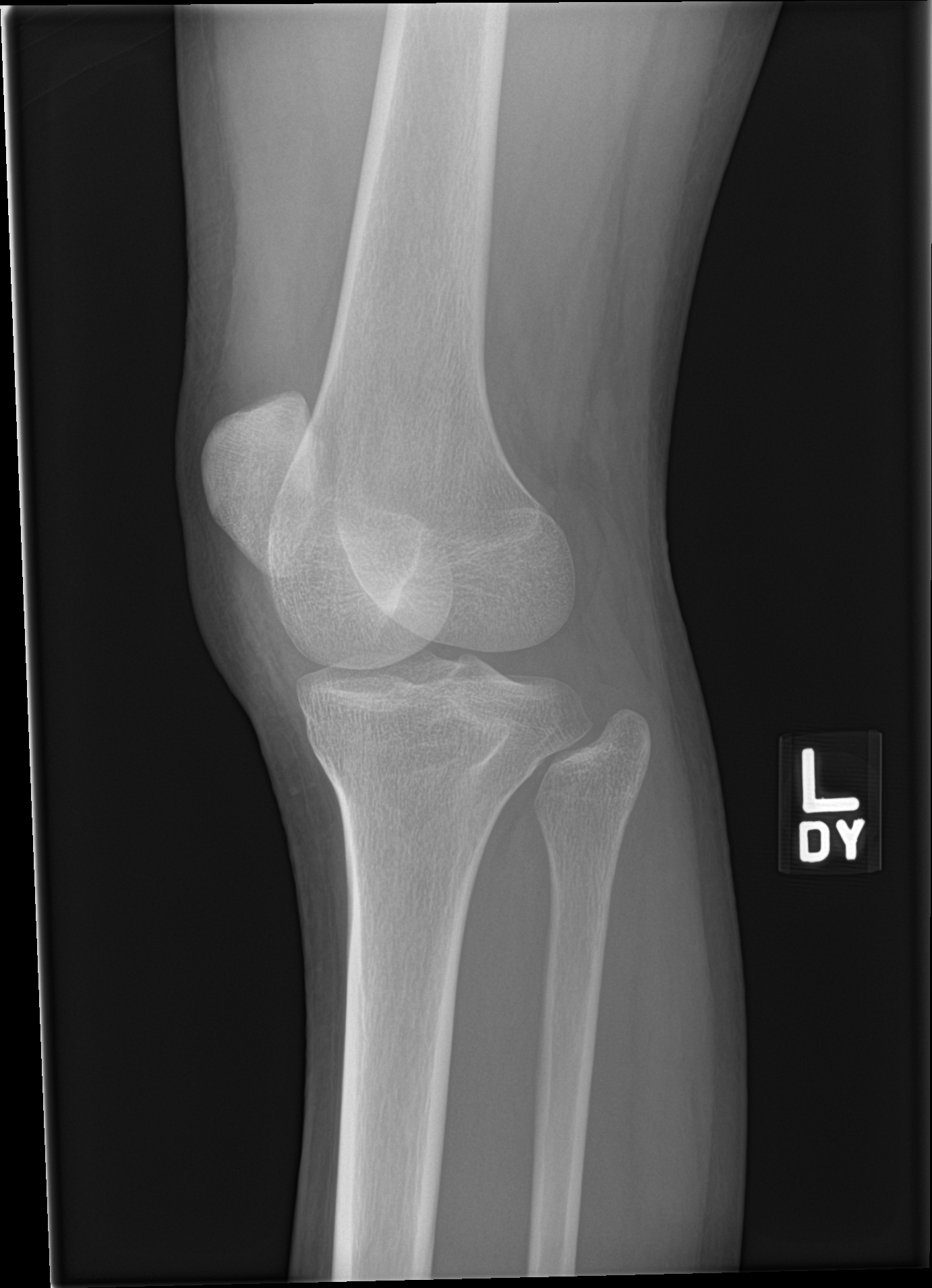

[4 of 4 positions shown; findings below may reference images not displayed]

FINDINGS: No acute bony or joint abnormality identified. No evidence of
fracture or dislocation.
IMPRESSION: No acute bony or joint abnormality identified.

## 2019-12-24 ENCOUNTER — Other Ambulatory Visit: Payer: Self-pay

## 2019-12-24 ENCOUNTER — Ambulatory Visit (INDEPENDENT_AMBULATORY_CARE_PROVIDER_SITE_OTHER): Payer: Medicaid Other | Admitting: Certified Nurse Midwife

## 2019-12-24 ENCOUNTER — Encounter: Payer: Self-pay | Admitting: Certified Nurse Midwife

## 2019-12-24 ENCOUNTER — Other Ambulatory Visit (HOSPITAL_COMMUNITY)
Admission: RE | Admit: 2019-12-24 | Discharge: 2019-12-24 | Disposition: A | Discharge status: Home / Self Care | Payer: Medicaid Other | Source: Ambulatory Visit | Attending: Certified Nurse Midwife | Admitting: Certified Nurse Midwife

## 2019-12-24 VITALS — BP 118/79 | HR 81 | Wt 113.0 lb

## 2019-12-24 DIAGNOSIS — Z3403 Encounter for supervision of normal first pregnancy, third trimester: Secondary | ICD-10-CM | POA: Insufficient documentation

## 2019-12-24 DIAGNOSIS — Z2839 Other underimmunization status: Secondary | ICD-10-CM

## 2019-12-24 DIAGNOSIS — Z3A36 36 weeks gestation of pregnancy: Secondary | ICD-10-CM

## 2019-12-24 DIAGNOSIS — O99891 Other specified diseases and conditions complicating pregnancy: Secondary | ICD-10-CM

## 2019-12-24 DIAGNOSIS — Z283 Underimmunization status: Secondary | ICD-10-CM

## 2019-12-24 DIAGNOSIS — Z3400 Encounter for supervision of normal first pregnancy, unspecified trimester: Secondary | ICD-10-CM

## 2019-12-24 NOTE — Progress Notes (Signed)
ROB [redacted]w[redacted]d GBS due today .  CT negative on 12/14/19  CC: None   Pt would like cervix check .

## 2019-12-24 NOTE — Patient Instructions (Addendum)
Group B Streptococcus Test During Pregnancy Why am I having this test? Routine testing, also called screening, for group B streptococcus (GBS) is recommended for all pregnant women between the 36th and 37th week of pregnancy. GBS is a type of bacteria that can be passed from mother to baby during childbirth. Screening will help guide whether or not you will need treatment during labor and delivery to prevent complications such as:  An infection in your uterus during labor.  An infection in your uterus after delivery.  A serious infection in your baby after delivery, such as pneumonia, meningitis, or sepsis. GBS screening is not often done before 36 weeks of pregnancy unless you go into labor prematurely. What happens if I have group B streptococcus? If testing shows that you have GBS, your health care provider will recommend treatment with IV antibiotics during labor and delivery. This treatment significantly decreases the risk of complications for you and your baby. If you have a planned C-section and you have GBS, you may not need to be treated with antibiotics because GBS is usually passed to babies after labor starts and your water breaks. If you are in labor or your water breaks before your C-section, it is possible for GBS to get into your uterus and be passed to your baby, so you might need treatment. Is there a chance I may not need to be tested? You may not need to be tested for GBS if:  You have a urine test that shows GBS before 36 to 37 weeks.  You had a baby with GBS infection after a previous delivery. In these cases, you will automatically be treated for GBS during labor and delivery. What is being tested? This test is done to check if you have group B streptococcus in your vagina or rectum. What kind of sample is taken? To collect samples for this test, your health care provider will swab your vagina and rectum with a cotton swab. The sample is then sent to the lab to see if  GBS is present. What happens during the test?   You will remove your clothing from the waist down.  You will lie down on an exam table in the same position as you would for a pelvic exam.  Your health care provider will swab your vagina and rectum to collect samples for a culture test.  You will be able to go home after the test and do all your usual activities. How are the results reported? The test results are reported as positive or negative. What do the results mean?  A positive test means you are at risk for passing GBS to your baby during labor and delivery. Your health care provider will recommend that you are treated with an IV antibiotic during labor and delivery.  A negative test means you are at very low risk of passing GBS to your baby. There is still a low risk of passing GBS to your baby because sometimes test results may report that you do not have a condition when you do (false-negative result) or there is a chance that you may become infected with GBS after the test is done. You most likely will not need to be treated with an antibiotic during labor and delivery. Talk with your health care provider about what your results mean. Questions to ask your health care provider Ask your health care provider, or the department that is doing the test:  When will my results be ready?  How will I   get my results?  What are my treatment options? Summary  Routine testing (screening) for group B streptococcus (GBS) is recommended for all pregnant women between the 36th and 37th week of pregnancy.  GBS is a type of bacteria that can be passed from mother to baby during childbirth.  If testing shows that you have GBS, your health care provider will recommend that you are treated with IV antibiotics during labor and delivery. This treatment almost always prevents infection in newborns. This information is not intended to replace advice given to you by your health care provider. Make  sure you discuss any questions you have with your health care provider. Document Revised: 06/26/2018 Document Reviewed: 04/02/2018 Elsevier Patient Education  2020 [employer].  Reasons to go to MAU:  1.  Contractions are  5 minutes apart or less, each last 1 minute, these have been going on for 1-2 hours, and you cannot walk or talk during them 2.  You have a large gush of fluid, or a trickle of fluid that will not stop and you have to wear a pad 3.  You have bleeding that is bright red, heavier than spotting--like menstrual bleeding (spotting can be normal in early labor or after a check of your cervix) 4.  You do not feel the baby moving like he/she normally does
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
## 2019-12-25 LAB — CERVICOVAGINAL ANCILLARY ONLY
Chlamydia: NEGATIVE
Comment: NEGATIVE
Comment: NEGATIVE
Comment: NORMAL
Neisseria Gonorrhea: NEGATIVE
Trichomonas: NEGATIVE

## 2019-12-26 LAB — STREP GP B NAA: Strep Gp B NAA: NEGATIVE

## 2019-12-31 ENCOUNTER — Telehealth: Payer: Medicaid Other | Admitting: Obstetrics

## 2020-01-07 ENCOUNTER — Inpatient Hospital Stay (HOSPITAL_COMMUNITY)
Admission: AD | Admit: 2020-01-07 | Discharge: 2020-01-09 | DRG: 807 | Disposition: A | Discharge status: Home / Self Care | Payer: Medicaid Other | Attending: Obstetrics & Gynecology | Admitting: Obstetrics & Gynecology

## 2020-01-07 ENCOUNTER — Other Ambulatory Visit: Payer: Self-pay

## 2020-01-07 ENCOUNTER — Encounter (HOSPITAL_COMMUNITY): Payer: Self-pay | Admitting: Obstetrics & Gynecology

## 2020-01-07 DIAGNOSIS — O4202 Full-term premature rupture of membranes, onset of labor within 24 hours of rupture: Secondary | ICD-10-CM

## 2020-01-07 DIAGNOSIS — Z3A38 38 weeks gestation of pregnancy: Secondary | ICD-10-CM

## 2020-01-07 DIAGNOSIS — O134 Gestational [pregnancy-induced] hypertension without significant proteinuria, complicating childbirth: Secondary | ICD-10-CM | POA: Diagnosis present

## 2020-01-07 DIAGNOSIS — O4292 Full-term premature rupture of membranes, unspecified as to length of time between rupture and onset of labor: Principal | ICD-10-CM | POA: Diagnosis present

## 2020-01-07 DIAGNOSIS — O4290 Premature rupture of membranes, unspecified as to length of time between rupture and onset of labor, unspecified weeks of gestation: Secondary | ICD-10-CM | POA: Diagnosis present

## 2020-01-07 DIAGNOSIS — Z23 Encounter for immunization: Secondary | ICD-10-CM

## 2020-01-07 DIAGNOSIS — O26893 Other specified pregnancy related conditions, third trimester: Secondary | ICD-10-CM | POA: Diagnosis present

## 2020-01-07 LAB — COMPREHENSIVE METABOLIC PANEL
ALT: 19 U/L (ref 0–44)
AST: 25 U/L (ref 15–41)
Albumin: 2.9 g/dL — ABNORMAL LOW (ref 3.5–5.0)
Alkaline Phosphatase: 157 U/L — ABNORMAL HIGH (ref 38–126)
Anion gap: 12 (ref 5–15)
BUN: 7 mg/dL (ref 6–20)
CO2: 20 mmol/L — ABNORMAL LOW (ref 22–32)
Calcium: 8.7 mg/dL — ABNORMAL LOW (ref 8.9–10.3)
Chloride: 105 mmol/L (ref 98–111)
Creatinine, Ser: 0.82 mg/dL (ref 0.44–1.00)
GFR, Estimated: >60 mL/min (ref >60)
Glucose, Bld: 73 mg/dL (ref 70–99)
Potassium: 3.3 mmol/L — ABNORMAL LOW (ref 3.5–5.1)
Sodium: 137 mmol/L (ref 135–145)
Total Bilirubin: 1.4 mg/dL — ABNORMAL HIGH (ref 0.3–1.2)
Total Protein: 6.3 g/dL — ABNORMAL LOW (ref 6.5–8.1)

## 2020-01-07 LAB — TYPE AND SCREEN
ABO/RH(D): A POS
Antibody Screen: NEGATIVE

## 2020-01-07 LAB — RPR: RPR Ser Ql: NONREACTIVE

## 2020-01-07 LAB — CBC
HCT: 32.4 % — ABNORMAL LOW (ref 36.0–46.0)
Hemoglobin: 10.2 g/dL — ABNORMAL LOW (ref 12.0–15.0)
MCH: 30.6 pg (ref 26.0–34.0)
MCHC: 31.5 g/dL (ref 30.0–36.0)
MCV: 97.3 fL (ref 80.0–100.0)
Platelets: 144 10*3/uL — ABNORMAL LOW (ref 150–400)
RBC: 3.33 MIL/uL — ABNORMAL LOW (ref 3.87–5.11)
RDW: 11.6 % (ref 11.5–15.5)
WBC: 11.1 10*3/uL — ABNORMAL HIGH (ref 4.0–10.5)
nRBC: 0 % (ref 0.0–0.2)

## 2020-01-07 LAB — POCT FERN TEST: POCT Fern Test: POSITIVE — AB

## 2020-01-07 MED ORDER — ONDANSETRON HCL 4 MG/2ML IJ SOLN: 4.0000 mg | INTRAMUSCULAR | Status: DC | PRN

## 2020-01-07 MED ORDER — OXYCODONE-ACETAMINOPHEN 5-325 MG PO TABS: 1.0000 | ORAL_TABLET | ORAL | Status: DC | PRN

## 2020-01-07 MED ORDER — FENTANYL CITRATE (PF) 100 MCG/2ML IJ SOLN
100.0000 ug | INTRAMUSCULAR | Status: DC | PRN
  Administered 2020-01-07: 100 ug via INTRAVENOUS
  Filled 2020-01-07 (×2): qty 2

## 2020-01-07 MED ORDER — OXYCODONE-ACETAMINOPHEN 5-325 MG PO TABS: 2.0000 | ORAL_TABLET | ORAL | Status: DC | PRN

## 2020-01-07 MED ORDER — ACETAMINOPHEN 325 MG PO TABS: 650.0000 mg | ORAL_TABLET | ORAL | Status: DC | PRN

## 2020-01-07 MED ORDER — LACTATED RINGERS IV SOLN: INTRAVENOUS | Status: DC

## 2020-01-07 MED ORDER — OXYTOCIN-SODIUM CHLORIDE 30-0.9 UT/500ML-% IV SOLN
2.5000 [IU]/h | INTRAVENOUS | Status: DC
  Filled 2020-01-07: qty 500

## 2020-01-07 MED ORDER — SIMETHICONE 80 MG PO CHEW: 80.0000 mg | CHEWABLE_TABLET | ORAL | Status: DC | PRN

## 2020-01-07 MED ORDER — SENNOSIDES-DOCUSATE SODIUM 8.6-50 MG PO TABS
2.0000 | ORAL_TABLET | ORAL | Status: DC
  Administered 2020-01-07: 2 via ORAL
  Filled 2020-01-07 (×2): qty 2

## 2020-01-07 MED ORDER — ONDANSETRON HCL 4 MG/2ML IJ SOLN: 4.0000 mg | Freq: Four times a day (QID) | INTRAMUSCULAR | Status: DC | PRN

## 2020-01-07 MED ORDER — SOD CITRATE-CITRIC ACID 500-334 MG/5ML PO SOLN: 30.0000 mL | ORAL | Status: DC | PRN

## 2020-01-07 MED ORDER — MEASLES, MUMPS & RUBELLA VAC IJ SOLR
0.5000 mL | Freq: Once | INTRAMUSCULAR | Status: AC
  Administered 2020-01-08: 0.5 mL via SUBCUTANEOUS
  Filled 2020-01-07: qty 0.5

## 2020-01-07 MED ORDER — ONDANSETRON HCL 4 MG PO TABS: 4.0000 mg | ORAL_TABLET | ORAL | Status: DC | PRN

## 2020-01-07 MED ORDER — WITCH HAZEL-GLYCERIN EX PADS: 1.0000 "application " | MEDICATED_PAD | CUTANEOUS | Status: DC | PRN

## 2020-01-07 MED ORDER — COCONUT OIL OIL: 1.0000 "application " | TOPICAL_OIL | Status: DC | PRN

## 2020-01-07 MED ORDER — ZOLPIDEM TARTRATE 5 MG PO TABS: 5.0000 mg | ORAL_TABLET | Freq: Every evening | ORAL | Status: DC | PRN

## 2020-01-07 MED ORDER — BENZOCAINE-MENTHOL 20-0.5 % EX AERO
1.0000 "application " | INHALATION_SPRAY | CUTANEOUS | Status: DC | PRN
  Administered 2020-01-07: 1 via TOPICAL
  Filled 2020-01-07: qty 56

## 2020-01-07 MED ORDER — OXYTOCIN BOLUS FROM INFUSION
333.0000 mL | Freq: Once | INTRAVENOUS | Status: AC
  Administered 2020-01-07: 333 mL via INTRAVENOUS

## 2020-01-07 MED ORDER — LIDOCAINE HCL (PF) 1 % IJ SOLN
30.0000 mL | INTRAMUSCULAR | Status: AC | PRN
  Administered 2020-01-07: 30 mL via SUBCUTANEOUS
  Filled 2020-01-07: qty 30

## 2020-01-07 MED ORDER — DIBUCAINE (PERIANAL) 1 % EX OINT: 1.0000 "application " | TOPICAL_OINTMENT | CUTANEOUS | Status: DC | PRN

## 2020-01-07 MED ORDER — LACTATED RINGERS IV SOLN: 500.0000 mL | INTRAVENOUS | Status: DC | PRN

## 2020-01-07 MED ORDER — TETANUS-DIPHTH-ACELL PERTUSSIS 5-2.5-18.5 LF-MCG/0.5 IM SUSP: 0.5000 mL | Freq: Once | INTRAMUSCULAR | Status: DC

## 2020-01-07 MED ORDER — DIPHENHYDRAMINE HCL 25 MG PO CAPS: 25.0000 mg | ORAL_CAPSULE | Freq: Four times a day (QID) | ORAL | Status: DC | PRN

## 2020-01-07 MED ORDER — PRENATAL MULTIVITAMIN CH
1.0000 | ORAL_TABLET | Freq: Every day | ORAL | Status: DC
  Administered 2020-01-07 – 2020-01-09 (×3): 1 via ORAL
  Filled 2020-01-07 (×3): qty 1

## 2020-01-07 MED ORDER — IBUPROFEN 600 MG PO TABS
600.0000 mg | ORAL_TABLET | Freq: Four times a day (QID) | ORAL | Status: DC
  Administered 2020-01-07 – 2020-01-09 (×9): 600 mg via ORAL
  Filled 2020-01-07 (×9): qty 1

## 2020-01-07 NOTE — Discharge Summary (Addendum)
Postpartum Discharge Summary     Patient Name: Michaela Gibson DOB: [birth year] MRN: [id number]  Date of admission: 01/07/2020 Delivery date:01/07/2020  Delivering provider: Seabron Spates  Date of discharge: 01/09/2020  Admitting diagnosis: PROM (premature rupture of membranes) [O42.90] Intrauterine pregnancy: [redacted]w[redacted]d    Secondary diagnosis:  Active Problems:   PROM (premature rupture of membranes)  Additional problems: none    Discharge diagnosis: Term Pregnancy Delivered                                              Post partum procedures:none Augmentation: N/A Complications: None  Hospital course: Onset of Labor With Vaginal Delivery      19y.o. yo G1P1001 at 344w1das admitted in Active Labor on 01/07/2020. Patient had an uncomplicated labor course as follows:  Membrane Rupture Time/Date: 3:30 AM ,01/07/2020   Delivery Method:Vaginal, Spontaneous  Episiotomy: None  Lacerations:  1st degree  Patient had an uncomplicated postpartum course.  She started out with ROM and early labor at 2cm.  Within a short time she progressed to complete dilation and pushed only 3 times to deliver 1st degree laceration, repaired. She was ambulating, tolerating a regular diet, passing flatus, and urinating well prior to discharge. Patient is discharged home in stable condition on 01/09/20.  Newborn Data: Birth date:01/07/2020  Birth time:5:43 AM  Gender:Female  Living status:Living  Apgars:8 ,9  Weight:2500 g   Magnesium Sulfate received: No BMZ received: No Rhophylac:No MMR:N/A T-DaP:Given prenatally Flu: declined Transfusion:No  Physical exam  Vitals:   01/08/20 0615 01/08/20 1438 01/08/20 2258 01/09/20 0622  BP: 115/72 127/75 113/78 101/61  Pulse: 65 88 76 85  Resp: '16 17  18  ' Temp: 99.1 F (37.3 C) 99.3 F (37.4 C) 98.4 F (36.9 C) 98.6 F (37 C)  TempSrc: Oral Oral Oral Oral  SpO2: 100% 100% 100% 100%  Weight:       General: alert, cooperative and no  distress Lochia: appropriate Uterine Fundus: firm Incision: N/A DVT Evaluation: No evidence of DVT seen on physical exam. No cords or calf tenderness. No significant calf/ankle edema.  Labs: Lab Results  Component Value Date   WBC 11.1 (H) 01/07/2020   HGB 10.2 (L) 01/07/2020   HCT 32.4 (L) 01/07/2020   MCV 97.3 01/07/2020   PLT 144 (L) 01/07/2020   CMP Latest Ref Rng & Units 01/07/2020  Glucose 70 - 99 mg/dL 73  BUN 6 - 20 mg/dL 7  Creatinine 0.44 - 1.00 mg/dL 0.82  Sodium 135 - 145 mmol/L 137  Potassium 3.5 - 5.1 mmol/L 3.3(L)  Chloride 98 - 111 mmol/L 105  CO2 22 - 32 mmol/L 20(L)  Calcium 8.9 - 10.3 mg/dL 8.7(L)  Total Protein 6.5 - 8.1 g/dL 6.3(L)  Total Bilirubin 0.3 - 1.2 mg/dL 1.4(H)  Alkaline Phos 38 - 126 U/L 157(H)  AST 15 - 41 U/L 25  ALT 0 - 44 U/L 19   Edinburgh Score: Edinburgh Postnatal Depression Scale Screening Tool 01/07/2020  I have been able to laugh and see the funny side of things. 0  I have looked forward with enjoyment to things. 0  I have blamed myself unnecessarily when things went wrong. 0  I have been anxious or worried for no good reason. 2  I have felt scared or panicky for no good reason. 0  Things have been  getting on top of me. 0  I have been so unhappy that I have had difficulty sleeping. 0  I have felt sad or miserable. 0  I have been so unhappy that I have been crying. 0  The thought of harming myself has occurred to me. 0  Edinburgh Postnatal Depression Scale Total 2      After visit meds:  Allergies as of 01/09/2020   No Known Allergies      Medication List     STOP taking these medications    Blood Pressure Kit Devi       TAKE these medications    ibuprofen 600 MG tablet Commonly known as: ADVIL Take 1 tablet (600 mg total) by mouth every 6 (six) hours.   norethindrone 0.35 MG tablet Commonly known as: Ortho Micronor Take 1 tablet (0.35 mg total) by mouth daily.   Prenatal Gummies/DHA & FA 0.4-32.5 MG  Chew Chew by mouth.         Discharge home in stable condition Infant Feeding: Breast Infant Disposition:home with mother Discharge instruction: per After Visit Summary and Postpartum booklet. Activity: Advance as tolerated. Pelvic rest for 6 weeks.  Diet: routine diet Anticipated Birth Control:  plans IUD Additional Postpartum F/U:  Future Appointments: Future Appointments  Date Time Provider [place]  02/04/2020 11:15 AM Gavin Pound, CNM [place] None   Follow up Visit:  Follow-up Information     [facility] Maternity Assessment Unit. Go to.   Specialty: Obstetrics and Gynecology Why: Go to the Maternal Assessment Unit if you develop any of the following: (1) temperature over 100.4*F (2) worsening pain, either vaginal or abdominal  (3) worsening bleeding or passing large clots Contact information: [address] [phone] [place]        [place]. Schedule an appointment as soon as possible for a visit in 6 week(s).   Why: Call and make a 6-week postpartum visit with your OB provider at [facility].  Contact information: [place] [id number] [phone]                Plan to follow up at [facility]  01/09/2020 Ezequiel Essex, MD  Attestation of Supervision of Student:  I confirm that I have verified the information documented in the  resident's  note and that I have also personally reperformed the history, physical exam and all medical decision making activities.  I have verified that all services and findings are accurately documented in this student's note; and I agree with management and plan as outlined in the documentation. I have also made any necessary editorial changes.  Randa Ngo, [place] for [facility] 01/09/2020 9:44 AM
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
## 2020-01-07 NOTE — MAU Note (Signed)
SROM at 0330-uncertain of color.  Reports contractions 5 minutes apart.  Endorses + FM.

## 2020-01-08 ENCOUNTER — Inpatient Hospital Stay: Payer: Medicaid Other

## 2020-01-08 DIAGNOSIS — Z23 Encounter for immunization: Secondary | ICD-10-CM

## 2020-01-08 MED ORDER — IBUPROFEN 600 MG PO TABS: 600.0000 mg | ORAL_TABLET | Freq: Four times a day (QID) | ORAL | 0 refills | Status: DC
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
## 2020-01-08 NOTE — Lactation Note (Signed)
This note was copied from a Michaela's chart. Lactation Consultation Note  Patient Name: Michaela Gibson LXBWI'[id number] Date: 01/08/2020  Michaela Gibson now 51 hours old.  Mom reports she did not get anything with pumping the first time she pumped but just got something last time pumped.  Mom just finished breastfeeding on left breast in sidelying on arrival.   Mom reports her nipples are not sore and intact.  Mom reports she feels she is breastfeeding well.   Mom has her breastmilk sitting there that she reportedly pumped about 1 hour ago.  Discussed supplementation past breastfeeding.  Mom reports she hasn't been doing it because she didn't get anything out.  Mom reports she tried formula and she did not like the bottle.  Mom reports tried her own bottles she brought from home and the flow was too fast she couldn't handle it.   Urged mom to pump 8 times a day and try to pump past breastfeedings.  Reviewed recommended supplement sheet with mom again.  Mom reports she has not heard from [facility]. Mom reports she will follow up with them on Monday.  LC assisted mom in feeding 12 ml if expressed mothers milk via curved tip syringe with finger. Infant tolerated well.   LC washed moms pump parts so she would be ready to pump again.  Urged mom to call lactation as needed.   Maternal Data    Feeding Feeding Type: Bottle Fed - Formula  LATCH Score Latch: Repeated attempts needed to sustain latch, nipple held in mouth throughout feeding, stimulation needed to elicit sucking reflex.  Audible Swallowing: None  Type of Nipple: Everted at rest and after stimulation  Comfort (Breast/Nipple): Soft / non-tender  Hold (Positioning): No assistance needed to correctly position infant at breast.  [facility] Score: 7  Interventions    Lactation Tools Discussed/Used     Consult Status      Neomia Dear 01/08/2020, 4:29 PM

## 2020-01-08 NOTE — Progress Notes (Signed)
   Covid-19 Vaccination Clinic  Name:  Jenelle Drennon    MRN: [id number] DOB: [birth year]  01/08/2020  Ms. Kysar was observed post Covid-19 immunization for 15 minutes without incident. She was provided with Vaccine Information Sheet and instruction to access the V-Safe system.   Ms. Oliphant was instructed to call 911 with any severe reactions post vaccine: Marland Kitchen Difficulty breathing  . Swelling of face and throat  . A fast heartbeat  . A bad rash all over body  . Dizziness and weakness   Immunizations Administered    Name Date Dose VIS Date Route   Pfizer COVID-19 Vaccine 01/08/2020 11:43 AM 0.3 mL 05/13/2018 Intramuscular   Manufacturer: [employer], [employer]   Lot: [id number]   NDC: [id number]

## 2020-01-08 NOTE — Lactation Note (Signed)
This note was copied from a baby's chart. Lactation Consultation Note  Patient Name: Michaela Gibson [id number] Date: 01/08/2020 Reason for consult: Initial assessment;Primapara;Early term 37-38.6wks;Infant < 6lbs Mom STS on arrival. Baby Michaela Mancebo now 69 hours old.  Assist with breastfeeding.   Infant latched and breastfed well. Rythmic sucking and intermittent swallows.   Urged mom to initiate pumping past breastfeeding.  Brought everything she needed to pump and demo how to use pump.   Urged to feed on cue and 8-12 times a day or more.  Left mom and baby breastfeeding.  Urged to call lactation as needed. Urged mom to follow up with [facility] regarding DEBP.  Maternal Data Formula Feeding for Exclusion: No Has patient been taught Hand Expression?: Yes Does the patient have breastfeeding experience prior to this delivery?: No  Feeding Feeding Type: Breast Milk  LATCH Score Latch: Repeated attempts needed to sustain latch, nipple held in mouth throughout feeding, stimulation needed to elicit sucking reflex.  Audible Swallowing: A few with stimulation  Type of Nipple: Everted at rest and after stimulation  Comfort (Breast/Nipple): Soft / non-tender  Hold (Positioning): Assistance needed to correctly position infant at breast and maintain latch.  LATCH Score: 7  Interventions Interventions: Assisted with latch;Hand express;Breast compression;Position options;Expressed milk  Lactation Tools Discussed/Used Breast pump type: Double-Electric Breast Pump;Manual WIC Program: Yes Pump Review: Setup, frequency, and cleaning;Milk Storage Initiated by:: LC (LC) Date initiated:: 01/07/20   Consult Status Consult Status: Follow-up Date: 01/08/20 Follow-up type: In-patient    [facility] [name] 01/08/2020, 12:26 AM

## 2020-01-08 NOTE — Lactation Note (Signed)
This note was copied from a baby's chart. Lactation Consultation Note  Patient Name: Michaela Gibson [id number] Date: 01/08/2020   Attempt to see mom. She was sleeping.  Let RN know.  Maternal Data Has patient been taught Hand Expression?: Yes Does the patient have breastfeeding experience prior to this delivery?: No  Feeding Feeding Type: Breast Milk  LATCH Score Latch: Repeated attempts needed to sustain latch, nipple held in mouth throughout feeding, stimulation needed to elicit sucking reflex.  Audible Swallowing: A few with stimulation  Type of Nipple: Everted at rest and after stimulation  Comfort (Breast/Nipple): Soft / non-tender  Hold (Positioning): Assistance needed to correctly position infant at breast and maintain latch.  LATCH Score: 7  Interventions Interventions: Assisted with latch;Hand express;Breast compression;Position options;Expressed milk  Lactation Tools Discussed/Used     Consult Status      Michaela Gibson [name] 01/08/2020, 12:18 AM

## 2020-01-09 MED ORDER — NORETHINDRONE 0.35 MG PO TABS: 1.0000 | ORAL_TABLET | Freq: Every day | ORAL | 2 refills | Status: DC

## 2020-01-09 NOTE — Discharge Instructions (Signed)

## 2020-01-09 NOTE — Lactation Note (Signed)
This note was copied from a baby's chart. Lactation Consultation Note  Patient Name: Girl Harue Pribble [id number] Date: 01/09/2020  Baby girl Burnsed now 51 hours old.  Weight loss 4 percent.  Baby SGA and early term. Mom has 2 oz of milk that she pumped on bedside table.  Praised moms efforts.  Mom reports she has not given her formula since she tried yesterday because she has plenty of breastmilk.  Mom reports she tries to breastfeed her first and then gives her the recommended bottle amount of supplement. Mom reports she is having a hard time with the flow of the bottle she prefers.  Gave mom nipple rate flow sheet and discussed them.   Mom reports she does not have thirty dollars for the [facility] loaner.  Mom reports she feels she can continue to pump okay with a hand pump until she can get into [facility]. Urged mom to continue to pump every few hours even though she has great milk volumes.  Urged her to continue to pump 8-12 times day for now until her milk supply established and infant is back up to birth weight and breastfeeding well.  Urged her to let her pediatrician know what they were doing.  Discussed outpatient appt.  Mom reports transportation issues at this time.   Praised efforts.  [facility] referral sent with confirmation.  Mom has breastfeeding Resource list for home use.  Urged mom to call lactation as needed.    Maternal Data    Feeding    LATCH Score                   Interventions    Lactation Tools Discussed/Used     Consult Status      Jahred Tatar [name] 01/09/2020, 12:40 PM

## 2020-02-04 ENCOUNTER — Ambulatory Visit (INDEPENDENT_AMBULATORY_CARE_PROVIDER_SITE_OTHER): Payer: Medicaid Other

## 2020-02-04 ENCOUNTER — Other Ambulatory Visit: Payer: Self-pay

## 2020-02-04 DIAGNOSIS — Z3009 Encounter for other general counseling and advice on contraception: Secondary | ICD-10-CM

## 2020-02-04 NOTE — Progress Notes (Signed)
Post Partum Visit Note  Michaela Gibson is a 19 y.o. G61P1001 female who presents for a postpartum visit. She is 4 weeks postpartum following a normal spontaneous vaginal delivery.  I have fully reviewed the prenatal and intrapartum course. The delivery was at 38.1 gestational weeks.  Anesthesia: local. Postpartum course has been doing well. Baby is doing well. Baby is feeding by breast with bottle feeding of breastmilk. Bleeding no bleeding. Bowel function is normal. Bladder function is normal. Patient is not sexually active. Contraception method is abstinence.  Postpartum depression screening: negative, score 1.  Patient is considering IUD for contraception method, but is unsure of which type. FOB is involved and supportive. Patient also receives support from her mother and siblings. She currently lives with her mother and siblings, endorses safety at home. She continues to have no concerns for DV/A. Patient with infant at bedside and appears to be bonding well.   The pregnancy intention screening data noted above was reviewed. Potential methods of contraception were discussed. The patient elected to proceed with IUD or IUS.    Edinburgh Postnatal Depression Scale - 02/04/20 1120      Edinburgh Postnatal Depression Scale:  In the Past 7 Days   I have been able to laugh and see the funny side of things. 0    I have looked forward with enjoyment to things. 0    I have blamed myself unnecessarily when things went wrong. 0    I have been anxious or worried for no good reason. 1    I have felt scared or panicky for no good reason. 0    Things have been getting on top of me. 0    I have been so unhappy that I have had difficulty sleeping. 0    I have felt sad or miserable. 0    I have been so unhappy that I have been crying. 0    The thought of harming myself has occurred to me. 0    Edinburgh Postnatal Depression Scale Total 1            The following portions of the patient's history were  reviewed and updated as appropriate: allergies, current medications, past family history, past medical history, past social history, past surgical history and problem list.  Review of Systems Pertinent items noted in HPI and remainder of comprehensive ROS otherwise negative.    Objective:  BP 118/73   Pulse 79   Wt 97 lb (44 kg)   LMP 04/05/2019   BMI 18.33 kg/m    General:  alert, cooperative and no distress   Breasts:  Not Examined  Lungs: clear to auscultation bilaterally  Heart:  regular rate and rhythm  Abdomen: soft, non-tender; bowel sounds normal; no masses,  no organomegaly   Vulva:  not evaluated  Vagina: not evaluated  Cervix:  Not Evaluated  Corpus: not examined  Adnexa:  not evaluated  Rectal Exam: Not performed.        Assessment:   4 weeks postpartum exam Pap smear Deferred BreastFeeding Desires IUD  Plan:   -Reviewed IUD types by hormonal vs non-hormonal benefits and side effects. -Patient states she would like one, but is still unsure of type. -Given pamphlets for Warner Mccreedy, and Nexplanon for further reading and consideration. -Instructed to RTO in 1-2 weeks for IUD placement, once she is decided, and pap smear. Patient agreeable with plan. -Patient okay to return to work and states she does not need  an excuse. -Discussed follow up as necessary.  Essential components of care per ACOG recommendations:  1.  Mood and well being: Patient with negative depression screening today. Reviewed local resources for support.  - Patient does not use tobacco.  - hx of drug use? No    2. Infant care and feeding:  -Patient currently breastmilk feeding? Yes If breastmilk feeding discussed return to work and pumping. If needed, patient was provided letter for work to allow for every 2-3 hr pumping breaks, and to be granted a private location to express breastmilk and refrigerated area to store breastmilk. Reviewed importance of draining breast  regularly to support lactation. -Social determinants of health (SDOH) reviewed in EPIC. No concerns  3. Sexuality, contraception and birth spacing - Patient does not want a pregnancy in the next year.  Desired family size is unsure of number of children.  - Reviewed forms of contraception in tiered fashion. Patient desired IUD today.   - Discussed birth spacing of 18 months  4. Sleep and fatigue -Encouraged family/partner/community support of 4 hrs of uninterrupted sleep to help with mood and fatigue  5. Physical Recovery  - Discussed patients delivery and complications-"Quick and Fast." - Patient had a 1st degree laceration, perineal healing reviewed. Patient expressed understanding - Patient has urinary incontinence? No - Patient is safe to resume physical and sexual activity  6.  Health Maintenance - No pap smear history d/t age. No Mammogram  7. No Chronic Disease - PCP follow up  Cherre Robins, CNM Center for [employer], [facility]

## 2020-02-04 NOTE — Patient Instructions (Addendum)
Pap Test Why am I having this test? A Pap test, also called a Pap smear, is a screening test to check for signs of:  Cancer of the vagina, cervix, and uterus. The cervix is the lower part of the uterus that opens into the vagina.  Infection.  Changes that may be a sign that cancer is developing (precancerous changes). Women need this test on a regular basis. In general, you should have a Pap test every 3 years until you reach menopause or age 21. Women aged 30-60 may choose to have their Pap test done at the same time as an HPV (human papillomavirus) test every 5 years (instead of every 3 years). Your health care provider may recommend having Pap tests more or less often depending on your medical conditions and past Pap test results. What kind of sample is taken?  Your health care provider will collect a sample of cells from the surface of your cervix. This will be done using a small cotton swab, plastic spatula, or brush. This sample is often collected during a pelvic exam, when you are lying on your back on an exam table with feet in footrests (stirrups). In some cases, fluids (secretions) from the cervix or vagina may also be collected. How do I prepare for this test?  Be aware of where you are in your menstrual cycle. If you are menstruating on the day of the test, you may be asked to reschedule.  You may need to reschedule if you have a known vaginal infection on the day of the test.  Follow instructions from your health care provider about: ? Changing or stopping your regular medicines. Some medicines can cause abnormal test results, such as digitalis and tetracycline. ? Avoiding douching or taking a bath the day before or the day of the test. Tell a health care provider about:  Any allergies you have.  All medicines you are taking, including vitamins, herbs, eye drops, creams, and over-the-counter medicines.  Any blood disorders you have.  Any surgeries you have had.  Any  medical conditions you have.  Whether you are pregnant or may be pregnant. How are the results reported? Your test results will be reported as either abnormal or normal. A false-positive result can occur. A false positive is incorrect because it means that a condition is present when it is not. A false-negative result can occur. A false negative is incorrect because it means that a condition is not present when it is. What do the results mean? A normal test result means that you do not have signs of cancer of the vagina, cervix, or uterus. An abnormal result may mean that you have:  Cancer. A Pap test by itself is not enough to diagnose cancer. You will have more tests done in this case.  Precancerous changes in your vagina, cervix, or uterus.  Inflammation of the cervix.  An STD (sexually transmitted disease).  A fungal infection.  A parasite infection. Talk with your health care provider about what your results mean. Questions to ask your health care provider Ask your health care provider, or the department that is doing the test:  When will my results be ready?  How will I get my results?  What are my treatment options?  What other tests do I need?  What are my next steps? Summary  In general, women should have a Pap test every 3 years until they reach menopause or age 21.  Your health care provider will collect a   sample of cells from the surface of your cervix. This will be done using a small cotton swab, plastic spatula, or brush.  In some cases, fluids (secretions) from the cervix or vagina may also be collected. This information is not intended to replace advice given to you by your health care provider. Make sure you discuss any questions you have with your health care provider. Document Revised: 11/12/2016 Document Reviewed: 11/12/2016 Elsevier Patient Education  2020 [employer]. Intrauterine Device Information An intrauterine device (IUD) is a medical device  that is inserted in the uterus to prevent pregnancy. It is a small, T-shaped device that has one or two nylon strings hanging down from it. The strings hang out of the lower part of the uterus (cervix) to allow for future IUD removal. There are two types of IUDs available:  Hormone IUD. This type of IUD is made of plastic and contains the hormone progestin (synthetic progesterone). A hormone IUD may last 3-5 years.  Copper IUD. This type of IUD has copper wire wrapped around it. A copper IUD may last up to 10 years. How is an IUD inserted? An IUD is inserted through the vagina and placed into the uterus with a minor medical procedure. The exact procedure for IUD insertion may vary among health care providers and hospitals. How does an IUD work? Synthetic progesterone in a hormonal IUD prevents pregnancy by:  Thickening cervical mucus to prevent sperm from entering the uterus.  Thinning the uterine lining to prevent a fertilized egg from being implanted there. Copper in a copper IUD prevents pregnancy by making the uterus and fallopian tubes produce a fluid that kills sperm. What are the advantages of an IUD? Advantages of either type of IUD  It is highly effective in preventing pregnancy.  It is reversible. You can become pregnant shortly after the IUD is removed.  It is low-maintenance and can stay in place for a long time.  There are no estrogen-related side effects.  It can be used when breastfeeding.  It is not associated with weight gain.  It can be inserted right after childbirth, an abortion, or a miscarriage. Advantages of a hormone IUD  If it is inserted within 7 days of your period starting, it works right after it is inserted. If the hormone IUD is inserted at any other time in your cycle, you will need to use a backup method of birth control for 7 days after insertion.  It can make menstrual periods lighter.  It can reduce menstrual cramping.  It can be used for 3-5  years. Advantages of a copper IUD  It works right after it is inserted.  It can be used as a form of emergency birth control if it is inserted within 5 days after having unprotected sex.  It does not interfere with your body's natural hormones.  It can be used for 10 years. What are the disadvantages of an IUD?  An IUD may cause irregular menstrual bleeding for a period of time after insertion.  You may have pain during insertion and have cramping and vaginal bleeding after insertion.  An IUD may cut the uterus (uterine perforation) when it is inserted. This is rare.  An IUD may cause pelvic inflammatory disease (PID), which is an infection in the uterus and fallopian tubes. This is rare, and it usually happens during the first 20 days after the IUD is inserted.  A copper IUD can make your menstrual flow heavier and more painful. How is  an IUD removed?  You will lie on your back with your knees bent and your feet in footrests (stirrups).  A device will be inserted into your vagina to spread apart the vaginal walls (speculum). This will allow your health care provider to see the strings attached to the IUD.  Your health care provider will use a small instrument (forceps) to grasp the IUD strings and pull firmly until the IUD is removed. You may have some discomfort when the IUD is removed. Your health care provider may recommend taking over-the-counter pain relievers, such as ibuprofen, before the procedure. You may also have minor spotting for a few days after the procedure. The exact procedure for IUD removal may vary among health care providers and hospitals. Is the IUD right for me? Your health care provider will make sure you are a good candidate for an IUD and will discuss the advantages, disadvantages, and possible side effects with you. Summary  An intrauterine device (IUD) is a medical device that is inserted in the uterus to prevent pregnancy. It is a small, T-shaped device  that has one or two nylon strings hanging down from it.  A hormone IUD contains the hormone progestin (synthetic progesterone). A copper IUD has copper wire wrapped around it.  Synthetic progesterone in a hormone IUD prevents pregnancy by thickening cervical mucus and thinning the walls of the uterus. Copper in a copper IUD prevents pregnancy by making the uterus and fallopian tubes produce a fluid that kills sperm.  A hormone IUD can be left in place for 3-5 years. A copper IUD can be left in place for up to 10 years.  An IUD is inserted and removed by a health care provider. You may feel some pain during insertion and removal. Your health care provider may recommend taking over-the-counter pain medicine, such as ibuprofen, before an IUD procedure. This information is not intended to replace advice given to you by your health care provider. Make sure you discuss any questions you have with your health care provider. Document Revised: 02/15/2017 Document Reviewed: 04/03/2016 Elsevier Patient Education  2020 [employer].

## 2020-02-18 ENCOUNTER — Ambulatory Visit: Payer: Medicaid Other | Admitting: Obstetrics and Gynecology

## 2020-02-18 ENCOUNTER — Other Ambulatory Visit: Payer: Self-pay

## 2020-03-23 ENCOUNTER — Ambulatory Visit (INDEPENDENT_AMBULATORY_CARE_PROVIDER_SITE_OTHER): Payer: Medicaid Other | Admitting: Obstetrics and Gynecology

## 2020-03-23 ENCOUNTER — Encounter: Payer: Self-pay | Admitting: Obstetrics and Gynecology

## 2020-03-23 ENCOUNTER — Other Ambulatory Visit: Payer: Self-pay

## 2020-03-23 VITALS — BP 100/63 | HR 72 | Wt 94.0 lb

## 2020-03-23 DIAGNOSIS — Z7251 High risk heterosexual behavior: Secondary | ICD-10-CM | POA: Diagnosis not present

## 2020-03-23 NOTE — Progress Notes (Signed)
RGYN patient presents for IUD insertion  LMP: No cycle since delivery  01/07/20 Pt is currently breastfeeding.   Last had unprotected intercourse day after X-Mas.   UPT: neg

## 2020-03-23 NOTE — Progress Notes (Signed)
I have reviewed the chart and agree with nursing staff's documentation of this patient's encounter. Patient will need to reschedule IUD insertion d/t unprotected SI 10 days ago and no LMP since delivery on 12/19/2019. UPT was negative today.  Raelyn Mora, CNM 03/23/2020 11:58 AM

## 2020-04-06 ENCOUNTER — Other Ambulatory Visit: Payer: Self-pay

## 2020-04-06 ENCOUNTER — Other Ambulatory Visit (HOSPITAL_COMMUNITY)
Admission: RE | Admit: 2020-04-06 | Discharge: 2020-04-06 | Disposition: A | Discharge status: Home / Self Care | Payer: Medicaid Other | Source: Ambulatory Visit | Attending: Nurse Practitioner | Admitting: Nurse Practitioner

## 2020-04-06 ENCOUNTER — Encounter: Payer: Self-pay | Admitting: Nurse Practitioner

## 2020-04-06 ENCOUNTER — Ambulatory Visit (INDEPENDENT_AMBULATORY_CARE_PROVIDER_SITE_OTHER): Payer: Medicaid Other | Admitting: Nurse Practitioner

## 2020-04-06 VITALS — Wt 96.0 lb

## 2020-04-06 DIAGNOSIS — Z30430 Encounter for insertion of intrauterine contraceptive device: Secondary | ICD-10-CM | POA: Insufficient documentation

## 2020-04-06 DIAGNOSIS — Z124 Encounter for screening for malignant neoplasm of cervix: Secondary | ICD-10-CM | POA: Diagnosis present

## 2020-04-06 LAB — POCT URINE PREGNANCY: Preg Test, Ur: NEGATIVE

## 2020-04-06 MED ORDER — LEVONORGESTREL 20 MCG/24HR IU IUD: INTRAUTERINE_SYSTEM | Freq: Once | INTRAUTERINE | Status: AC

## 2020-04-06 NOTE — Progress Notes (Addendum)
Michaela Gibson 19 y.o. Three months postpartum and comes for IUD insertion today - Mirena.  Pregnancy test is negative.  Is breastfeeding so does not have menses since birth.  Is now 19 so will do pap smear today.  Reviewed risks and benefits of IUD.   IUD Insertion Procedure Note Patient identified, informed consent performed, consent signed.   Discussed risks of irregular bleeding, cramping, infection, malpositioning or misplacement of the IUD outside the uterus which may require further procedure such as laparoscopy. Time out was performed.  Urine pregnancy test negative.  Speculum placed in the vagina.  Cervix visualized.  STD testing and Pap done.  Cleaned with Betadine x 2.  Hurricane spray used.  Grasped posteriorly with a single tooth tenaculum.  Uterus sounded to 8 cm.  Mirena UD placed per manufacturer's recommendations.  Strings trimmed to 2 cm. Tenaculum was removed, good hemostasis noted.  Patient tolerated procedure well.   Patient was given post-procedure instructions.  She was advised to have backup contraception for one week.  Patient was also asked to check IUD strings periodically and follow up in 4 weeks for IUD check.  Lot [id number] Exp Jan 2024  Nolene Bernheim, RN, MSN, NP-BC Nurse Practitioner, [facility] for [employer], [facility] 04/06/2020 11:45 AM  ADDENDUM: Positive chlamydia seen on testing done at time of IUD insertion.  Medication sent to her pharmacy.  Please do TOC at time of IUD string check.  Nolene Bernheim, RN, MSN, NP-BC Nurse Practitioner, [facility] for [employer], [facility] 04/09/2020 6:34 PM

## 2020-04-06 NOTE — Patient Instructions (Signed)

## 2020-04-06 NOTE — Progress Notes (Addendum)
GYN presents for IUD insertion, patient wants Mirena. LMP 12/2019. Last unprotected sex was Christmas 2021.  UPT today is  NEGATIVE  Patient is due for first PAP  Administrations This Visit    levonorgestrel (MIRENA) 20 MCG/24HR IUD    Admin Date 04/06/2020 Action Given Dose  Route Intrauterine Administered By Maretta Bees, RMA

## 2020-04-07 LAB — CERVICOVAGINAL ANCILLARY ONLY
Bacterial Vaginitis (gardnerella): POSITIVE — AB
Candida Glabrata: NEGATIVE
Candida Vaginitis: NEGATIVE
Chlamydia: POSITIVE — AB
Comment: NEGATIVE
Comment: NEGATIVE
Comment: NEGATIVE
Comment: NEGATIVE
Comment: NEGATIVE
Comment: NORMAL
Neisseria Gonorrhea: NEGATIVE
Trichomonas: NEGATIVE

## 2020-04-08 ENCOUNTER — Other Ambulatory Visit: Payer: Self-pay

## 2020-04-08 MED ORDER — AZITHROMYCIN 500 MG PO TABS
ORAL_TABLET | ORAL | 0 refills | Status: DC
Start: 2020-04-08 — End: 2020-12-13

## 2020-04-08 MED ORDER — METRONIDAZOLE 500 MG PO TABS: 500.0000 mg | ORAL_TABLET | Freq: Two times a day (BID) | ORAL | 0 refills | Status: DC

## 2020-04-08 NOTE — Telephone Encounter (Signed)
TC to patient to discuss test results.She has an STD chlamydia and will need to be treated with Azithromycin 1 gr po once.  Flagyl- for BV She has been advised to refrain from sexual intercourse for 7-14 days. Partner will need to be treated and can go to the HD. TOC in four weeks.

## 2020-04-09 ENCOUNTER — Encounter: Payer: Self-pay | Admitting: Nurse Practitioner

## 2020-04-09 DIAGNOSIS — A749 Chlamydial infection, unspecified: Secondary | ICD-10-CM | POA: Insufficient documentation

## 2020-04-09 LAB — CYTOLOGY - PAP
Diagnosis: NEGATIVE
Diagnosis: REACTIVE

## 2020-05-04 ENCOUNTER — Other Ambulatory Visit: Payer: Self-pay

## 2020-05-04 ENCOUNTER — Ambulatory Visit (INDEPENDENT_AMBULATORY_CARE_PROVIDER_SITE_OTHER): Payer: Medicaid Other

## 2020-05-04 VITALS — BP 109/73 | HR 62 | Ht 62.0 in | Wt 92.0 lb

## 2020-05-04 DIAGNOSIS — Z30431 Encounter for routine checking of intrauterine contraceptive device: Secondary | ICD-10-CM

## 2020-05-04 NOTE — Progress Notes (Signed)
Patient presents for IUD string check.

## 2020-05-04 NOTE — Progress Notes (Signed)
GYN presents for String check.  Last PAP 04/06/2020.  Reports no problems today.

## 2020-05-04 NOTE — Progress Notes (Signed)
    GYNECOLOGY OFFICE ENCOUNTER NOTE  History:  20 y.o. G1P1001 here today for today for IUD string check; Mirena  IUD was placed  Apr 06, 2020. Patient reports she was having some vaginal bleeding, but that has since slowed down.  Patient has no other concerning side effects.  The following portions of the patient's history were reviewed and updated as appropriate: allergies, current medications, past family history, past medical history, past social history, past surgical history and problem list. Last pap smear on Jan 2022 was normal, negative HRHPV.  Review of Systems:  Pertinent items are noted in HPI.   Objective:  Physical Exam Blood pressure 109/73, pulse 62, height 5\' 2"  (1.575 m), weight 92 lb (41.7 kg), last menstrual period 04/08/2020, currently breastfeeding. CONSTITUTIONAL: Well-developed, well-nourished female in no acute distress.  HENT:  Normocephalic, atraumatic. External right and left ear normal. Oropharynx is clear and moist EYES: Conjunctivae and EOM are normal.  NECK: Normal range of motion, supple, no masses CARDIOVASCULAR: Normal heart rate noted RESPIRATORY: Effort and breath sounds normal, no problems with respiration noted ABDOMEN: Soft, no distention noted.   PELVIC: Normal appearing external genitalia; normal appearing vaginal mucosa and cervix.  IUD strings visualized, about 2 cm in length outside cervix. Bimanual exam performed and no device appreciated at external os.   Assessment & Plan:  20 year old Mirena IUD in Place  Informed IUD can remain in place for 7 years. Reviewed pap smear results.  Informed pap smear to be repeated in 3 years.  Discussed returning to office for expulsion, bleeding, or discomfort during sex. RTO as needed  36 MSN, CNM Advanced Practice Provider, Center for Cherre Robins

## 2020-12-13 ENCOUNTER — Other Ambulatory Visit (HOSPITAL_COMMUNITY)
Admission: RE | Admit: 2020-12-13 | Discharge: 2020-12-13 | Disposition: A | Discharge status: Home / Self Care | Payer: Medicaid Other | Source: Ambulatory Visit | Attending: Obstetrics | Admitting: Obstetrics

## 2020-12-13 ENCOUNTER — Other Ambulatory Visit: Payer: Self-pay

## 2020-12-13 ENCOUNTER — Ambulatory Visit (INDEPENDENT_AMBULATORY_CARE_PROVIDER_SITE_OTHER): Payer: Medicaid Other | Admitting: Obstetrics

## 2020-12-13 ENCOUNTER — Encounter: Payer: Self-pay | Admitting: Obstetrics

## 2020-12-13 VITALS — BP 112/70 | HR 59 | Wt 87.0 lb

## 2020-12-13 DIAGNOSIS — N898 Other specified noninflammatory disorders of vagina: Secondary | ICD-10-CM | POA: Diagnosis present

## 2020-12-13 DIAGNOSIS — R102 Pelvic and perineal pain: Secondary | ICD-10-CM | POA: Diagnosis not present

## 2020-12-13 DIAGNOSIS — Z975 Presence of (intrauterine) contraceptive device: Secondary | ICD-10-CM | POA: Diagnosis not present

## 2020-12-13 DIAGNOSIS — N939 Abnormal uterine and vaginal bleeding, unspecified: Secondary | ICD-10-CM

## 2020-12-13 NOTE — Progress Notes (Signed)
Patient ID: Michaela Gibson, female   DOB: December 19, 2000, 20 y.o.   MRN: [id number]  Chief Complaint  Patient presents with   Gynecologic Exam    HPI Michaela Gibson is a 20 y.o. female.  Complains of cramping and spotting for past 2 weeks.  Has had an IUD since January 2022. HPI  Past Medical History:  Diagnosis Date   Asthma     Past Surgical History:  Procedure Laterality Date   NO PAST SURGERIES      Family History  Problem Relation Age of Onset   Healthy Mother    Asthma Mother    Healthy Father    Asthma Father    Asthma Brother    Diabetes Paternal Grandmother     Social History Social History   Tobacco Use   Smoking status: Never   Smokeless tobacco: Never  Vaping Use   Vaping Use: Never used  Substance Use Topics   Alcohol use: No   Drug use: Never    No Known Allergies  Current Outpatient Medications  Medication Sig Dispense Refill   ibuprofen (ADVIL) 600 MG tablet Take 1 tablet (600 mg total) by mouth every 6 (six) hours. (Patient not taking: Reported on 03/23/2020) 30 tablet 0   Prenatal MV-Min-FA-Omega-3 (PRENATAL GUMMIES/DHA & FA) 0.4-32.5 MG CHEW Chew by mouth. (Patient not taking: Reported on 03/23/2020)     No current facility-administered medications for this visit.    Review of Systems Review of Systems Constitutional: negative for fatigue and weight loss Respiratory: negative for cough and wheezing Cardiovascular: negative for chest pain, fatigue and palpitations Gastrointestinal: negative for abdominal pain and change in bowel habits Genitourinary: positive for cramping, spotting and vaginal discharge Integument/breast: negative for nipple discharge Musculoskeletal:negative for myalgias Neurological: negative for gait problems and tremors Behavioral/Psych: negative for abusive relationship, depression Endocrine: negative for temperature intolerance      Blood pressure 112/70, pulse (!) 59, weight 87 lb (39.5 kg), currently  breastfeeding.  Physical Exam Physical Exam General:   Alert and no discharge  Skin:   no rash or abnormalities  Lungs:   clear to auscultation bilaterally  Heart:   regular rate and rhythm, S1, S2 normal, no murmur, click, rub or gallop  Breasts:   normal without suspicious masses, skin or nipple changes or axillary nodes  Abdomen:  normal findings: no organomegaly, soft, non-tender and no hernia  Pelvis:  External genitalia: normal general appearance Urinary system: urethral meatus normal and bladder without fullness, nontender Vaginal: normal without tenderness, induration or masses Cervix: normal appearance.  IUD string visible Adnexa: normal bimanual exam Uterus: anteverted and non-tender, normal size    I have spent a total of 20 minutes of face-to-face time, excluding clinical staff time, reviewing notes and preparing to see patient, ordering tests and/or medications, and counseling the patient.   Data Reviewed Wet Prep Cultures  Assessment     1. Pelvic pain Rx: - US PELVIC COMPLETE WITH TRANSVAGINAL; Future  2. Vaginal discharge Rx: - Cervicovaginal ancillary only( [place])  3. Abnormal uterine bleeding (AUB)  4. IUD (intrauterine device) in place      Plan   Follow up in 2 weeks  Orders Placed This Encounter  Procedures   US PELVIC COMPLETE WITH TRANSVAGINAL    Standing Status:   Future    Standing Expiration Date:   12/13/2021    Order Specific Question:   Reason for Exam (SYMPTOM  OR DIAGNOSIS REQUIRED)    Answer:   Pelvic pain.  AUB.  IUD in place    Order Specific Question:   Preferred imaging location?    Answer:   [facility]      Brock Bad, MD 12/13/2020 11:36 AM

## 2020-12-13 NOTE — Progress Notes (Signed)
Pt has had IUD since January 2022. Pt states she has had recent cramping and spotting with IUD.  Pt states this is a new problem.

## 2020-12-14 ENCOUNTER — Other Ambulatory Visit: Payer: Self-pay | Admitting: Obstetrics

## 2020-12-14 DIAGNOSIS — B9689 Other specified bacterial agents as the cause of diseases classified elsewhere: Secondary | ICD-10-CM

## 2020-12-14 DIAGNOSIS — A5901 Trichomonal vulvovaginitis: Secondary | ICD-10-CM

## 2020-12-14 DIAGNOSIS — N760 Acute vaginitis: Secondary | ICD-10-CM

## 2020-12-14 LAB — CERVICOVAGINAL ANCILLARY ONLY
Bacterial Vaginitis (gardnerella): POSITIVE — AB
Candida Glabrata: NEGATIVE
Candida Vaginitis: NEGATIVE
Chlamydia: NEGATIVE
Comment: NEGATIVE
Comment: NEGATIVE
Comment: NEGATIVE
Comment: NEGATIVE
Comment: NEGATIVE
Comment: NORMAL
Neisseria Gonorrhea: NEGATIVE
Trichomonas: POSITIVE — AB

## 2020-12-14 MED ORDER — METRONIDAZOLE 500 MG PO TABS: 500.0000 mg | ORAL_TABLET | Freq: Two times a day (BID) | ORAL | 2 refills | Status: DC

## 2020-12-15 ENCOUNTER — Encounter: Payer: Self-pay | Admitting: *Deleted

## 2020-12-15 NOTE — Progress Notes (Signed)
TC to patient to inform of BV and Trichomonas. No answer. No VM available. MyChart message sent regarding diagnosis and RX sent for Flagyl. Patient education included in message.

## 2020-12-16 ENCOUNTER — Other Ambulatory Visit: Payer: Self-pay

## 2020-12-16 ENCOUNTER — Ambulatory Visit (HOSPITAL_BASED_OUTPATIENT_CLINIC_OR_DEPARTMENT_OTHER)
Admission: RE | Admit: 2020-12-16 | Discharge: 2020-12-16 | Disposition: A | Discharge status: Home / Self Care | Payer: Medicaid Other | Source: Ambulatory Visit | Attending: Obstetrics | Admitting: Obstetrics

## 2020-12-16 DIAGNOSIS — R102 Pelvic and perineal pain: Secondary | ICD-10-CM | POA: Insufficient documentation

## 2020-12-27 ENCOUNTER — Telehealth (INDEPENDENT_AMBULATORY_CARE_PROVIDER_SITE_OTHER): Payer: Medicaid Other | Admitting: Obstetrics

## 2020-12-27 ENCOUNTER — Encounter: Payer: Self-pay | Admitting: Obstetrics

## 2020-12-27 DIAGNOSIS — A5901 Trichomonal vulvovaginitis: Secondary | ICD-10-CM

## 2020-12-27 DIAGNOSIS — Z975 Presence of (intrauterine) contraceptive device: Secondary | ICD-10-CM

## 2020-12-27 DIAGNOSIS — R102 Pelvic and perineal pain: Secondary | ICD-10-CM | POA: Diagnosis not present

## 2020-12-27 NOTE — Progress Notes (Signed)
GYNECOLOGY VIRTUAL VISIT ENCOUNTER NOTE  Provider location: Center for [facility]   Patient location: Home  I connected with Michaela Gibson on 12/27/20 at 11:15 AM EDT by MyChart Video Encounter and verified that I am speaking with the correct person using two identifiers.   I discussed the limitations, risks, security and privacy concerns of performing an evaluation and management service virtually and the availability of in person appointments. I also discussed with the patient that there may be a patient responsible charge related to this service. The patient expressed understanding and agreed to proceed.   History:  Michaela Gibson is a 20 y.o. G6P1001 female being evaluated today for pelvic pain.  The pelvic pain has resolved.  Ultrasound done.  She denies any abnormal vaginal discharge, bleeding, pelvic pain or other concerns.       Past Medical History:  Diagnosis Date   Asthma    Past Surgical History:  Procedure Laterality Date   NO PAST SURGERIES     The following portions of the patient's history were reviewed and updated as appropriate: allergies, current medications, past family history, past medical history, past social history, past surgical history and problem list.   Health Maintenance:  Normal pap and negative HRHPV on 04-06-2020.   Review of Systems:  Pertinent items noted in HPI and remainder of comprehensive ROS otherwise negative.  Physical Exam:   General:  Alert, oriented and cooperative. Patient appears to be in no acute distress.  Mental Status: Normal mood and affect. Normal behavior. Normal judgment and thought content.   Respiratory: Normal respiratory effort, no problems with respiration noted  Rest of physical exam deferred due to type of encounter  Labs and Imaging Results for orders placed or performed in visit on 12/13/20 (from the past 336 hour(s))  Cervicovaginal ancillary only( [place])   Collection Time: 12/13/20 11:28  AM  Result Value Ref Range   Neisseria Gonorrhea Negative    Chlamydia Negative    Trichomonas Positive (A)    Bacterial Vaginitis (gardnerella) Positive (A)    Candida Vaginitis Negative    Candida Glabrata Negative    Comment      Normal Reference Range Bacterial Vaginosis - Negative   Comment Normal Reference Range Candida Species - Negative    Comment Normal Reference Range Candida Galbrata - Negative    Comment Normal Reference Range Trichomonas - Negative    Comment Normal Reference Ranger Chlamydia - Negative    Comment      Normal Reference Range Neisseria Gonorrhea - Negative   US PELVIC COMPLETE WITH TRANSVAGINAL  Result Date: 12/16/2020 CLINICAL DATA:  Pelvic pain, abnormal uterine bleeding, IUD, unknown LMP EXAM: TRANSABDOMINAL AND TRANSVAGINAL ULTRASOUND OF PELVIS TECHNIQUE: Both transabdominal and transvaginal ultrasound examinations of the pelvis were performed. Transabdominal technique was performed for global imaging of the pelvis including uterus, ovaries, adnexal regions, and pelvic cul-de-sac. It was necessary to proceed with endovaginal exam following the transabdominal exam to visualize the endometrium and LEFT ovary. COMPARISON:  None FINDINGS: Uterus Measurements: 7.4 x 4.2 x 5.8 cm = volume: 93 mL. Retroverted. Normal morphology without mass. Endometrium Thickness: 7 mm. No endometrial fluid or mass. IUD in expected position at upper uterine segment endometrial canal. Right ovary Measurements: 4.5 x 2.4 x 3.5 cm = volume: 20.3 mL. Dominant physiologic follicle without additional mass; no follow-up imaging recommended. Left ovary Measurements: 2.8 x 2.7 x 1.8 cm = volume: 7.0 mL. Normal morphology without mass Other findings 10  mm LEFT paraovarian cyst; no follow-up imaging recommended. No additional pelvic masses. Small amount of free pelvic fluid. IMPRESSION: IUD in expected position at upper uterine segment endometrial canal. No significant pelvic sonographic  abnormalities identified. Electronically Signed   By: Ulyses Southward M.D.   On: 12/16/2020 15:41       Assessment and Plan:     1. Pelvic pain, resolved - ultrasound is normal  2. Trichomonas vaginitis, treated - condoms recommended for STD prevention  3. IUD (intrauterine device) in place    I discussed the assessment and treatment plan with the patient. The patient was provided an opportunity to ask questions and all were answered. The patient agreed with the plan and demonstrated an understanding of the instructions.   The patient was advised to call back or seek an in-person evaluation/go to the ED if the symptoms worsen or if the condition fails to improve as anticipated.  I have spent a total of 20 minutes of face-to-face  time, excluding clinical staff time, reviewing notes and preparing to see patient, ordering tests and/or medications, and counseling the patient.    Coral Ceo, MD Center for [facility], [facility]  12/27/20

## 2021-02-23 ENCOUNTER — Emergency Department: Payer: Medicaid Other

## 2021-02-23 ENCOUNTER — Encounter: Payer: Self-pay | Admitting: Emergency Medicine

## 2021-02-23 ENCOUNTER — Emergency Department
Admission: EM | Admit: 2021-02-23 | Discharge: 2021-02-23 | Disposition: A | Discharge status: Home / Self Care | Payer: Medicaid Other | Attending: Emergency Medicine | Admitting: Emergency Medicine

## 2021-02-23 ENCOUNTER — Other Ambulatory Visit: Payer: Self-pay

## 2021-02-23 DIAGNOSIS — S6992XA Unspecified injury of left wrist, hand and finger(s), initial encounter: Secondary | ICD-10-CM | POA: Diagnosis present

## 2021-02-23 DIAGNOSIS — S0990XA Unspecified injury of head, initial encounter: Secondary | ICD-10-CM | POA: Diagnosis not present

## 2021-02-23 DIAGNOSIS — J45909 Unspecified asthma, uncomplicated: Secondary | ICD-10-CM | POA: Diagnosis not present

## 2021-02-23 DIAGNOSIS — S62655A Nondisplaced fracture of medial phalanx of left ring finger, initial encounter for closed fracture: Secondary | ICD-10-CM | POA: Insufficient documentation

## 2021-02-23 MED ORDER — IBUPROFEN 400 MG PO TABS
400.0000 mg | ORAL_TABLET | Freq: Once | ORAL | Status: AC
  Administered 2021-02-23: 400 mg via ORAL
  Filled 2021-02-23: qty 1
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
## 2021-02-23 NOTE — ED Notes (Signed)
First Nurse Note:  Pt to ED via POV, pt states that she fell and hit her head. Pt also hurt her hand. Pt is in NAD.

## 2021-02-23 NOTE — Discharge Instructions (Signed)
You have a broken bone in the middle of your left index finger.  Please keep the splint on for the next week.  Please follow-up with a hand specialist.  You can use Tylenol and Motrin for pain.

## 2021-06-08 ENCOUNTER — Other Ambulatory Visit: Payer: Self-pay

## 2021-06-08 ENCOUNTER — Emergency Department (HOSPITAL_COMMUNITY)
Admission: EM | Admit: 2021-06-08 | Discharge: 2021-06-09 | Disposition: A | Discharge status: Home / Self Care | Payer: Medicaid Other | Attending: Emergency Medicine | Admitting: Emergency Medicine

## 2021-06-08 DIAGNOSIS — R059 Cough, unspecified: Secondary | ICD-10-CM | POA: Diagnosis present

## 2021-06-08 DIAGNOSIS — Z20822 Contact with and (suspected) exposure to covid-19: Secondary | ICD-10-CM | POA: Insufficient documentation

## 2021-06-08 DIAGNOSIS — J069 Acute upper respiratory infection, unspecified: Secondary | ICD-10-CM | POA: Diagnosis not present

## 2021-06-08 MED ORDER — ACETAMINOPHEN 500 MG PO TABS
1000.0000 mg | ORAL_TABLET | Freq: Once | ORAL | Status: AC
Start: 2021-06-08 — End: 2021-06-08
  Administered 2021-06-08: 1000 mg via ORAL
  Filled 2021-06-08: qty 2

## 2021-06-08 NOTE — ED Provider Triage Note (Signed)
?  Emergency Medicine Provider Triage Evaluation Note ? ?MRN:  [id number]  ?Arrival date & time: 06/08/21    ?Medically screening exam initiated at 11:00 PM.   ?CC:   ?No chief complaint on file. ?  ?HPI:  ?Michaela Gibson is a 21 y.o. year-old female presents to the ED with chief complaint of flu symptoms.  Reports associated fever, chills, cough, sore throat, and body aches. Has tried OTC meds without much relief. ? ?History provided by patient. ?ROS:  ?-As included in HPI ?PE:  ? ?Vitals:  ? 06/08/21 2251  ?BP: 106/69  ?Pulse: (!) 103  ?Resp: 17  ?Temp: 99.8 ?F (37.7 ?C)  ?SpO2: 98%  ?  ?Non-toxic appearing ?No respiratory distress ? ?MDM:  ?Based on signs and symptoms, cold or flu is highest on my differential, followed by COVID. ?I've ordered swabs in triage to expedite lab/diagnostic workup. ? ?Patient was informed that the remainder of the evaluation will be completed by another provider, this initial triage assessment does not replace that evaluation, and the importance of remaining in the ED until their evaluation is complete. ? ?  ?Montine Circle, PA-C ?06/08/21 2302 ? ?

## 2021-06-08 NOTE — ED Triage Notes (Signed)
Pt arrives to ED POV c/o coughg, runny nose since Monday. No other complaints at this time. ?

## 2021-06-09 ENCOUNTER — Encounter (HOSPITAL_COMMUNITY): Payer: Self-pay | Admitting: Emergency Medicine

## 2021-06-09 LAB — RESP PANEL BY RT-PCR (FLU A&B, COVID) ARPGX2
Influenza A by PCR: NEGATIVE
Influenza B by PCR: NEGATIVE
SARS Coronavirus 2 by RT PCR: NEGATIVE

## 2021-06-09 MED ORDER — BENZONATATE 100 MG PO CAPS
200.0000 mg | ORAL_CAPSULE | Freq: Once | ORAL | Status: AC
Start: 2021-06-09 — End: 2021-06-09
  Administered 2021-06-09: 200 mg via ORAL
  Filled 2021-06-09: qty 2

## 2021-06-09 NOTE — ED Provider Notes (Signed)
?[facility] EMERGENCY DEPARTMENT ?Provider Note ? ? ?CSN: [id number] ?Arrival date & time: 06/08/21  2245 ? ?  ? ?History ? ?Chief Complaint  ?Patient presents with  ? Cough  ? ? ?Michaela Gibson is a 21 y.o. female. ? ?The history is provided by the patient.  ?Cough ?Cough characteristics:  Non-productive ?Severity:  Mild ?Onset quality:  Gradual ?Duration:  5 days ?Timing:  Sporadic ?Progression:  Unchanged ?Chronicity:  New ?Context: upper respiratory infection   ?Relieved by:  Nothing ?Worsened by:  Nothing ?Ineffective treatments:  None tried ?Associated symptoms: rhinorrhea and sinus congestion   ?Associated symptoms: no chest pain, no diaphoresis, no fever, no shortness of breath, no sore throat and no weight loss   ?Risk factors: no chemical exposure and no recent infection   ? ?  ? ?Home Medications ?Prior to Admission medications   ?Medication Sig Start Date End Date Taking? Authorizing Provider  ?ibuprofen (ADVIL) 600 MG tablet Take 1 tablet (600 mg total) by mouth every 6 (six) hours. ?Patient not taking: Reported on 03/23/2020 01/08/20   Jacklyn Shell, CNM  ?metroNIDAZOLE (FLAGYL) 500 MG tablet Take 1 tablet (500 mg total) by mouth 2 (two) times daily. 12/14/20   Brock Bad, MD  ?Prenatal MV-Min-FA-Omega-3 (PRENATAL GUMMIES/DHA & FA) 0.4-32.5 MG CHEW Chew by mouth. ?Patient not taking: Reported on 03/23/2020    [provider]  ?   ? ?Allergies    ?Patient has no known allergies.   ? ?Review of Systems   ?Review of Systems  ?Constitutional:  Negative for diaphoresis, fever and weight loss.  ?HENT:  Positive for rhinorrhea. Negative for facial swelling and sore throat.   ?Eyes:  Negative for redness.  ?Respiratory:  Positive for cough. Negative for shortness of breath.   ?Cardiovascular:  Negative for chest pain.  ?Gastrointestinal:  Negative for abdominal pain.  ?Genitourinary:  Negative for difficulty urinating.  ?Neurological:  Negative for facial asymmetry.   ?Psychiatric/Behavioral:  Negative for agitation.   ?All other systems reviewed and are negative. ? ?Physical Exam ?Updated Vital Signs ?BP 106/69 (BP Location: Right Arm)   Pulse (!) 103   Temp 99.8 ?F (37.7 ?C) (Oral)   Resp 17   Ht 5\' 2"  (1.575 m)   Wt 43.1 kg   SpO2 98%   BMI 17.38 kg/m?  ?Physical Exam ?Vitals and nursing note reviewed.  ?Constitutional:   ?   General: She is not in acute distress. ?   Appearance: Normal appearance.  ?HENT:  ?   Head: Normocephalic and atraumatic.  ?   Nose: Nose normal.  ?Eyes:  ?   Conjunctiva/sclera: Conjunctivae normal.  ?   Pupils: Pupils are equal, round, and reactive to light.  ?Cardiovascular:  ?   Rate and Rhythm: Normal rate and regular rhythm.  ?   Pulses: Normal pulses.  ?   Heart sounds: Normal heart sounds.  ?Pulmonary:  ?   Effort: Pulmonary effort is normal.  ?   Breath sounds: Normal breath sounds.  ?Abdominal:  ?   General: Bowel sounds are normal.  ?   Palpations: Abdomen is soft.  ?   Tenderness: There is no abdominal tenderness. There is no guarding.  ?Musculoskeletal:     ?   General: Normal range of motion.  ?   Cervical back: Normal range of motion and neck supple.  ?Lymphadenopathy:  ?   Cervical: No cervical adenopathy.  ?Skin: ?   General: Skin is warm and dry.  ?  Capillary Refill: Capillary refill takes less than 2 seconds.  ?Neurological:  ?   General: No focal deficit present.  ?   Mental Status: She is alert and oriented to person, place, and time.  ?   Deep Tendon Reflexes: Reflexes normal.  ?Psychiatric:     ?   Mood and Affect: Mood normal.     ?   Behavior: Behavior normal.  ? ? ?ED Results / Procedures / Treatments   ?Labs ?(all labs ordered are listed, but only abnormal results are displayed) ?Labs Reviewed  ?RESP PANEL BY RT-PCR (FLU A&B, COVID) [id number]  ? ? ?EKG ?None ? ?Radiology ?No results found. ? ?Procedures ?Procedures  ? ? ?Medications Ordered in ED ?Medications  ?benzonatate (TESSALON) capsule 200 mg (has no administration  in time range)  ?acetaminophen (TYLENOL) tablet 1,000 mg (1,000 mg Oral Given 06/08/21 2315)  ? ? ?ED Course/ Medical Decision Making/ A&P ?  ?                        ?Medical Decision Making ?URI since Monday. Has not taken anything  ? ?Amount and/or Complexity of Data Reviewed ?Independent Historian: spouse ?   Details: see above ?External Data Reviewed: notes. ?   Details: previous ED visits ?Labs: ordered. ?   Details: covid and flu are negative by my reading ? ?Risk ?Prescription drug management. ?Risk Details: Lungs are clear, no signs of PNA.  I do not believe imaging is merited at this time.  This is a viral URI.  Tylenol and ibuprofen for pain or fever, symptomatic relief for cough  ? ? ? ?Final Clinical Impression(s) / ED Diagnoses ?Final diagnoses:  ?None  ? ?Return for intractable cough, coughing up blood, fevers > 100.4 unrelieved by medication, shortness of breath, intractable vomiting, chest pain, shortness of breath, weakness, numbness, changes in speech, facial asymmetry, abdominal pain, passing out, Inability to tolerate liquids or food, cough, altered mental status or any concerns. No signs of systemic illness or infection. The patient is nontoxic-appearing on exam and vital signs are within normal limits.  ?I have reviewed the triage vital signs and the nursing notes. Pertinent labs & imaging results that were available during my care of the patient were reviewed by me and considered in my medical decision making (see chart for details). After history, exam, and medical workup I feel the patient has been appropriately medically screened and is safe for discharge home. Pertinent diagnoses were discussed with the patient. Patient was given return precautions. ?Rx / DC Orders ?ED Discharge Orders   ? ? None  ? ?  ? ? ?  ?Ulrich Soules, MD ?06/09/21 0226 ? ?

## 2021-06-09 NOTE — ED Notes (Signed)
RN reviewed discharge instructions with pt. Pt verbalized understanding and had no further questions. VSS upon discharge.  

## 2021-09-05 ENCOUNTER — Emergency Department (HOSPITAL_COMMUNITY)
Admission: EM | Admit: 2021-09-05 | Discharge: 2021-09-05 | Discharge status: Against Medical Advice | Payer: Medicaid Other | Attending: Emergency Medicine | Admitting: Emergency Medicine

## 2021-09-05 ENCOUNTER — Encounter (HOSPITAL_COMMUNITY): Payer: Self-pay | Admitting: Emergency Medicine

## 2021-09-05 ENCOUNTER — Emergency Department (HOSPITAL_COMMUNITY): Payer: Medicaid Other

## 2021-09-05 ENCOUNTER — Other Ambulatory Visit: Payer: Self-pay

## 2021-09-05 DIAGNOSIS — R102 Pelvic and perineal pain: Secondary | ICD-10-CM | POA: Diagnosis not present

## 2021-09-05 DIAGNOSIS — Z5321 Procedure and treatment not carried out due to patient leaving prior to being seen by health care provider: Secondary | ICD-10-CM | POA: Insufficient documentation

## 2021-09-05 LAB — URINALYSIS, ROUTINE W REFLEX MICROSCOPIC
Bilirubin Urine: NEGATIVE
Glucose, UA: NEGATIVE mg/dL
Hgb urine dipstick: NEGATIVE
Ketones, ur: NEGATIVE mg/dL
Nitrite: NEGATIVE
Protein, ur: NEGATIVE mg/dL
Specific Gravity, Urine: 1.028 (ref 1.005–1.030)
pH: 5 (ref 5.0–8.0)

## 2021-09-05 LAB — PREGNANCY, URINE: Preg Test, Ur: NEGATIVE

## 2021-09-05 MED ORDER — ONDANSETRON 4 MG PO TBDP
4.0000 mg | ORAL_TABLET | Freq: Once | ORAL | Status: AC
  Administered 2021-09-05: 4 mg via ORAL
  Filled 2021-09-05: qty 1

## 2021-09-05 MED ORDER — IBUPROFEN 800 MG PO TABS
800.0000 mg | ORAL_TABLET | Freq: Once | ORAL | Status: AC
  Administered 2021-09-05: 800 mg via ORAL
  Filled 2021-09-05: qty 1

## 2021-09-05 NOTE — ED Notes (Signed)
Pt called 3x no answer  

## 2021-09-05 NOTE — ED Triage Notes (Signed)
Patient reports hypogastric cramping for 2 days , she suspects IUD is dislodged , denies emesis or vaginal bleeding .

## 2021-09-05 NOTE — ED Notes (Signed)
Pt called 2x for vitals  

## 2021-09-05 NOTE — ED Provider Triage Note (Signed)
  Emergency Medicine Provider Triage Evaluation Note  MRN:  [id number]  Arrival date & time: 09/05/21    Medically screening exam initiated at 12:58 AM.   CC:   Abdominal Cramping   HPI:  Michaela Gibson is a 21 y.o. year-old female presents to the ED with chief complaint of pelvic pain.  States that she is concerned that her IUD is not in place.  She denies bleeding.  Reports cramping for the past day.  History provided by History provided by patient. ROS:  -As included in HPI PE:   Vitals:   09/05/21 0047  BP: 121/64  Pulse: 73  Resp: 16  Temp: 99 F (37.2 C)  SpO2: 99%    Non-toxic appearing No respiratory distress  MDM:  Labs and imaging ordered.  Patient was informed that the remainder of the evaluation will be completed by another provider, this initial triage assessment does not replace that evaluation, and the importance of remaining in the ED until their evaluation is complete.    Roxy Horseman, PA-C 09/05/21 0100

## 2021-11-17 ENCOUNTER — Encounter (HOSPITAL_COMMUNITY): Payer: Self-pay | Admitting: Emergency Medicine

## 2021-11-17 ENCOUNTER — Ambulatory Visit (HOSPITAL_COMMUNITY)
Admission: EM | Admit: 2021-11-17 | Discharge: 2021-11-17 | Disposition: A | Discharge status: Home / Self Care | Payer: Medicaid Other | Attending: Physician Assistant | Admitting: Physician Assistant

## 2021-11-17 DIAGNOSIS — K0889 Other specified disorders of teeth and supporting structures: Secondary | ICD-10-CM

## 2021-11-17 DIAGNOSIS — K047 Periapical abscess without sinus: Secondary | ICD-10-CM

## 2021-11-17 MED ORDER — TRAMADOL HCL 50 MG PO TABS: 50.0000 mg | ORAL_TABLET | Freq: Four times a day (QID) | ORAL | 0 refills | Status: DC | PRN

## 2021-11-17 MED ORDER — PENICILLIN V POTASSIUM 500 MG PO TABS: 500.0000 mg | ORAL_TABLET | Freq: Four times a day (QID) | ORAL | 0 refills | Status: AC

## 2021-11-17 NOTE — Discharge Instructions (Signed)
Advised to take the penicillin every 6 hours till completed to treat the dental abscess. Advised take the Ultram tablets 1-2 every 6-8 hours as needed for pain relief. Advised to make sure and keep dental appointment to have the tooth addressed. Advised follow-up PCP or return to urgent care as needed.
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
## 2021-11-17 NOTE — ED Triage Notes (Signed)
Pt reports left lower dental pain and swelling. States she has had pain for a week and noticed the swelling yesterday.

## 2021-12-18 ENCOUNTER — Other Ambulatory Visit: Payer: Self-pay

## 2021-12-18 ENCOUNTER — Encounter (HOSPITAL_COMMUNITY): Payer: Self-pay

## 2021-12-18 ENCOUNTER — Emergency Department (HOSPITAL_COMMUNITY)
Admission: EM | Admit: 2021-12-18 | Discharge: 2021-12-19 | Disposition: A | Discharge status: Home / Self Care | Payer: Medicaid Other | Attending: Emergency Medicine | Admitting: Emergency Medicine

## 2021-12-18 DIAGNOSIS — K0889 Other specified disorders of teeth and supporting structures: Secondary | ICD-10-CM | POA: Diagnosis present

## 2021-12-18 MED ORDER — HYDROCODONE-ACETAMINOPHEN 5-325 MG PO TABS
1.0000 | ORAL_TABLET | Freq: Once | ORAL | Status: AC
  Administered 2021-12-18: 1 via ORAL
  Filled 2021-12-18: qty 1

## 2021-12-18 NOTE — ED Provider Triage Note (Signed)
Emergency Medicine Provider Triage Evaluation Note  Michaela Gibson , a 21 y.o. female  was evaluated in triage.  Pt complains of dental pain.  Patient has known fractured tooth.  She is seen dentist outpatient who states that she will not remove tooth given location near nerve and recommended oral surgeon.  She has an appointment with oral surgeon at the end of next month.  She presents today because dental pain has gotten acutely worse over the past 3 days.  She reports pain with chewing but states she has been able to tolerate liquids.  Denies difficulty breathing.  Denies fever, chills, night sweats.  Review of Systems  Positive: See above Negative:   Physical Exam  BP 125/80 (BP Location: Right Arm)   Pulse 65   Temp 99 F (37.2 C) (Oral)   Resp 16   Ht 5\' 2"  (1.575 m)   Wt 46.7 kg   SpO2 97%   BMI 18.84 kg/m  Gen:   Awake, no distress   Resp:  Normal effort  MSK:   Moves extremities without difficulty  Other:  Eroded cavity left lower most posterior molar.  Uvula midline rises symmetrically with phonation.  Medical Decision Making  Medically screening exam initiated at 5:28 PM.  Appropriate orders placed.  Eveny Anastas was informed that the remainder of the evaluation will be completed by another provider, this initial triage assessment does not replace that evaluation, and the importance of remaining in the ED until their evaluation is complete.     Wilnette Kales, [place] 12/18/21 1730

## 2021-12-18 NOTE — ED Triage Notes (Signed)
Reports left lower molar  pain x 1 month has seen dentist but can't have it removed for 1 month.  Reports its now making her whole jaw hurt bilaterally.

## 2021-12-19 MED ORDER — KETOROLAC TROMETHAMINE 30 MG/ML IJ SOLN
30.0000 mg | Freq: Once | INTRAMUSCULAR | Status: AC
Start: 2021-12-19 — End: 2021-12-19
  Administered 2021-12-19: 30 mg via INTRAMUSCULAR
  Filled 2021-12-19: qty 1

## 2021-12-19 MED ORDER — PENICILLIN V POTASSIUM 500 MG PO TABS: 500.0000 mg | ORAL_TABLET | Freq: Four times a day (QID) | ORAL | 0 refills | Status: DC

## 2021-12-19 MED ORDER — PENICILLIN V POTASSIUM 250 MG PO TABS
500.0000 mg | ORAL_TABLET | Freq: Once | ORAL | Status: AC
Start: 2021-12-19 — End: 2021-12-19
  Administered 2021-12-19: 500 mg via ORAL
  Filled 2021-12-19: qty 2

## 2021-12-19 NOTE — ED Provider Notes (Signed)
[facility] Emergency Department Provider Note MRN:  [id number]  Arrival date & time: 12/19/21     Chief Complaint   Dental Pain   History of Present Illness   Michaela Gibson is a 21 y.o. year-old female presents to the ED with chief complaint of dental pain. Has emerging and broken wisdom tooth, which she says she's going to have removed next month.  She reports worsening pain over the past day.  She denies any fevers.  She has been taking Tylenol without relief.   History provided by patient.   Review of Systems  Pertinent positive and negative review of systems noted in HPI.    Physical Exam   Vitals:   12/18/21 1959 12/19/21 0145  BP: 114/87 109/71  Pulse: (!) 58 67  Resp: 12 18  Temp: 98.3 F (36.8 C) 98.3 F (36.8 C)  SpO2: 100% 100%    CONSTITUTIONAL:  well-appearing, NAD NEURO:  Alert and oriented x 3, CN 3-12 grossly intact EYES:  eyes equal and reactive ENT/NECK:  Supple, no stridor, cracked left lower wisdom tooth, no surrounding abscess CARDIO:  appears well-perfused  PULM:  No respiratory distress,  GI/GU:  non-distended,  MSK/SPINE:  No gross deformities, no edema, moves all extremities  SKIN:  no rash, atraumatic   *Additional and/or pertinent findings included in MDM below  Diagnostic and Interventional Summary    EKG Interpretation  Date/Time:    Ventricular Rate:    PR Interval:    QRS Duration:   QT Interval:    QTC Calculation:   R Axis:     Text Interpretation:         Labs Reviewed - No data to display  No orders to display    Medications  penicillin v potassium (VEETID) tablet 500 mg (has no administration in time range)  ketorolac (TORADOL) 30 MG/ML injection 30 mg (has no administration in time range)  HYDROcodone-acetaminophen (NORCO/VICODIN) 5-325 MG per tablet 1 tablet (1 tablet Oral Given 12/18/21 1758)     Procedures  /  Critical Care Procedures  ED Course and Medical Decision Making  I have  reviewed the triage vital signs, the nursing notes, and pertinent available records from the EMR.  Social Determinants Affecting Complexity of Care: Patient has no clinically significant social determinants affecting this chief complaint..   ED Course:    Medical Decision Making Patient with dentalgia.  No abscess requiring immediate incision and drainage.  Exam not concerning for Ludwig's angina or pharyngeal abscess.  Will treat with toradol IM, patient denies pregnancy and penicillin PO. Pt instructed to follow-up with dentist.  Discussed return precautions. Pt safe for discharge.   Risk Prescription drug management.     Consultants: No consultations were needed in caring for this patient.   Treatment and Plan: Emergency department workup does not suggest an emergent condition requiring admission or immediate intervention beyond  what has been performed at this time. The patient is safe for discharge and has  been instructed to return immediately for worsening symptoms, change in  symptoms or any other concerns    Final Clinical Impressions(s) / ED Diagnoses     ICD-10-CM   1. Pain, dental  K08.89       ED Discharge Orders          Ordered    penicillin v potassium (VEETID) 500 MG tablet  4 times daily        12/19/21 0229  Discharge Instructions Discussed with and Provided to Patient:   Discharge Instructions   None      Roxy Horseman, PA-C 12/19/21 0230    Shon Baton, MD 12/20/21 [phone]

## 2021-12-20 ENCOUNTER — Telehealth: Payer: Medicaid Other | Admitting: Nurse Practitioner

## 2021-12-20 DIAGNOSIS — K0889 Other specified disorders of teeth and supporting structures: Secondary | ICD-10-CM | POA: Diagnosis not present

## 2021-12-20 MED ORDER — NAPROXEN 500 MG PO TABS: 500.0000 mg | ORAL_TABLET | Freq: Two times a day (BID) | ORAL | 0 refills | Status: DC
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
## 2021-12-20 NOTE — Patient Instructions (Signed)
Dental Pain Dental pain is often a sign that something is wrong with your teeth or gums. You can also have pain after a dental treatment. If you have dental pain, it is important to contact your dentist, especially if the cause of the pain is not known. Dental pain may hurt a lot or a little and can be caused by many things, including: Tooth decay (cavities or caries). Infection. The inner part of the tooth being filled with pus (an abscess). Injury. A crack in the tooth. Gums that move back and expose the root of a tooth. Gum disease. Abnormal grinding or clenching of teeth. Not taking good care of your teeth. Sometimes the cause of pain is not known. You may have pain all the time, or it may happen only when you are: Chewing. Exposed to hot or cold temperatures. Eating or drinking foods or drinks that have a lot of sugar in them, such as soda or candy. Follow these instructions at home: Medicines Take over-the-counter and prescription medicines only as told by your dentist. If you were prescribed an antibiotic medicine, take it as told by your dentist. Do not stop taking it even if you start to feel better. Eating and drinking Do not eat foods or drinks that cause you pain. These include: Very hot or very cold foods or drinks. Sweet or sugary foods or drinks. Managing pain and swelling  If told, put ice on the painful area of your face. To do this: Put ice in a plastic bag. Place a towel between your skin and the bag. Leave the ice on for 20 minutes, 2-3 times a day. Take off the ice if your skin turns bright red. This is very important. If you cannot feel pain, heat, or cold, you have a greater risk of damage to the area. Brushing your teeth Brush your teeth twice a day using a fluoride toothpaste. Use a toothpaste made for sensitive teeth as told by your dentist. Use a soft toothbrush. General instructions Floss your teeth at least once a day. Do not put heat on the outside  of your face. Rinse your mouth often with salt water. To make salt water, dissolve -1 tsp (3-6 g) of salt in 1 cup (237 mL) of warm water. Watch your dental pain. Let your dentist know if there are any changes. Keep all follow-up visits. Contact a dentist if: You have dental pain and you do not know why. Medicine does not help your pain. Your symptoms get worse. You have new symptoms. Get help right away if: You cannot open your mouth. You are having trouble breathing or swallowing. You have a fever. Your face, neck, or jaw is swollen. These symptoms may be an emergency. Get help right away. Call your local emergency services (911 in the [place] Do not wait to see if the symptoms will go away. Do not drive yourself to the hospital. Summary Dental pain may be caused by many things, including tooth decay, injury, or infection. In some cases, the cause is not known. Dental pain may hurt a lot or very little. You may have pain all the time, or you may have it only when you eat or drink. Take over-the-counter and prescription medicines only as told by your dentist. Watch your dental pain for any changes. Let your dentist know if symptoms get worse. This information is not intended to replace advice given to you by your health care provider. Make sure you discuss any questions you have with   your health care provider. Document Revised: 12/09/2019 Document Reviewed: 12/09/2019 Elsevier Patient Education  2023 [employer]  

## 2022-02-09 ENCOUNTER — Other Ambulatory Visit: Payer: Self-pay

## 2022-02-09 ENCOUNTER — Emergency Department (HOSPITAL_COMMUNITY)
Admission: EM | Admit: 2022-02-09 | Discharge: 2022-02-09 | Disposition: A | Discharge status: Home / Self Care | Payer: Medicaid Other | Attending: Emergency Medicine | Admitting: Emergency Medicine

## 2022-02-09 DIAGNOSIS — R42 Dizziness and giddiness: Secondary | ICD-10-CM | POA: Insufficient documentation

## 2022-02-09 DIAGNOSIS — R112 Nausea with vomiting, unspecified: Secondary | ICD-10-CM | POA: Insufficient documentation

## 2022-02-09 DIAGNOSIS — J45909 Unspecified asthma, uncomplicated: Secondary | ICD-10-CM | POA: Diagnosis not present

## 2022-02-09 DIAGNOSIS — R109 Unspecified abdominal pain: Secondary | ICD-10-CM | POA: Insufficient documentation

## 2022-02-09 MED ORDER — ONDANSETRON 4 MG PO TBDP
4.0000 mg | ORAL_TABLET | Freq: Once | ORAL | Status: AC
Start: 2022-02-09 — End: 2022-02-09
  Administered 2022-02-09: 4 mg via ORAL
  Filled 2022-02-09: qty 1

## 2022-02-09 MED ORDER — ONDANSETRON 4 MG PO TBDP: 4.0000 mg | ORAL_TABLET | Freq: Three times a day (TID) | ORAL | 0 refills | Status: AC | PRN

## 2022-02-09 NOTE — ED Triage Notes (Signed)
Pt BIB by EMS from home. Pt c/o nausea, vomitting, and feeling lightheaded. Pt symptoms started around 6pm last night. Pt states that she started a new antibiotic for a tooth infection yesterday. Pt has not vomitted since has been with EMS. EMS reports VS are stable.   LVS  118/90  64P  100% RA
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
## 2022-11-18 ENCOUNTER — Emergency Department
Admission: EM | Admit: 2022-11-18 | Discharge: 2022-11-18 | Disposition: A | Discharge status: Home / Self Care | Payer: Medicaid Other | Attending: Emergency Medicine | Admitting: Emergency Medicine

## 2022-11-18 ENCOUNTER — Other Ambulatory Visit: Payer: Self-pay

## 2022-11-18 DIAGNOSIS — R5383 Other fatigue: Secondary | ICD-10-CM | POA: Diagnosis present

## 2022-11-18 DIAGNOSIS — J45909 Unspecified asthma, uncomplicated: Secondary | ICD-10-CM | POA: Insufficient documentation

## 2022-11-18 DIAGNOSIS — R112 Nausea with vomiting, unspecified: Secondary | ICD-10-CM | POA: Diagnosis not present

## 2022-11-18 DIAGNOSIS — U071 COVID-19: Secondary | ICD-10-CM | POA: Diagnosis not present

## 2022-11-18 DIAGNOSIS — R1084 Generalized abdominal pain: Secondary | ICD-10-CM | POA: Insufficient documentation

## 2022-11-18 LAB — COMPREHENSIVE METABOLIC PANEL
ALT: 14 U/L (ref 0–44)
AST: 22 U/L (ref 15–41)
Albumin: 4.4 g/dL (ref 3.5–5.0)
Alkaline Phosphatase: 55 U/L (ref 38–126)
Anion gap: 10 (ref 5–15)
BUN: 18 mg/dL (ref 6–20)
CO2: 25 mmol/L (ref 22–32)
Calcium: 9.1 mg/dL (ref 8.9–10.3)
Chloride: 102 mmol/L (ref 98–111)
Creatinine, Ser: 0.71 mg/dL (ref 0.44–1.00)
GFR, Estimated: >60 mL/min (ref >60)
Glucose, Bld: 90 mg/dL (ref 70–99)
Potassium: 3.4 mmol/L — ABNORMAL LOW (ref 3.5–5.1)
Sodium: 137 mmol/L (ref 135–145)
Total Bilirubin: 2.8 mg/dL — ABNORMAL HIGH (ref 0.3–1.2)
Total Protein: 8.1 g/dL (ref 6.5–8.1)

## 2022-11-18 LAB — POC URINE PREG, ED: Preg Test, Ur: NEGATIVE

## 2022-11-18 LAB — URINALYSIS, ROUTINE W REFLEX MICROSCOPIC
Bacteria, UA: NONE SEEN
Glucose, UA: NEGATIVE mg/dL
Ketones, ur: 15 mg/dL — AB
Leukocytes,Ua: NEGATIVE
Nitrite: NEGATIVE
Specific Gravity, Urine: 1.025 (ref 1.005–1.030)
pH: 6.5 (ref 5.0–8.0)

## 2022-11-18 LAB — CBC
HCT: 38.2 % (ref 36.0–46.0)
Hemoglobin: 12.2 g/dL (ref 12.0–15.0)
MCH: 29.8 pg (ref 26.0–34.0)
MCHC: 31.9 g/dL (ref 30.0–36.0)
MCV: 93.4 fL (ref 80.0–100.0)
Platelets: 155 10*3/uL (ref 150–400)
RBC: 4.09 MIL/uL (ref 3.87–5.11)
RDW: 11.4 % — ABNORMAL LOW (ref 11.5–15.5)
WBC: 7 10*3/uL (ref 4.0–10.5)
nRBC: 0 % (ref 0.0–0.2)

## 2022-11-18 LAB — LIPASE, BLOOD: Lipase: 39 U/L (ref 11–51)

## 2022-11-18 MED ORDER — ONDANSETRON 8 MG PO TBDP: 8.0000 mg | ORAL_TABLET | Freq: Three times a day (TID) | ORAL | 0 refills | Status: DC | PRN

## 2022-11-18 NOTE — ED Triage Notes (Signed)
Pt sts that she has been having N/V with abd cramping. Pt sts that there is a possibility that she could be pregnant.

## 2022-11-18 NOTE — ED Provider Notes (Signed)
[facility] Provider Note   Event Date/Time   First MD Initiated Contact with Patient 11/18/22 1456     (approximate) History  Abdominal Pain  HPI Michaela Gibson is a 22 y.o. female with a past medical history of asthma who presents for nausea vomiting and abdominal cramping as well as generalized fatigue.  Patient is concerned that she could be pregnant.  Patient endorses generalized abdominal cramping with associated nausea and vomiting over the last 2 days.  Patient denies any recent travel or sick contacts.  Patient denies fevers. ROS: Patient currently denies any vision changes, tinnitus, difficulty speaking, facial droop, sore throat, chest pain, shortness of breath, diarrhea, dysuria, or weakness/numbness/paresthesias in any extremity   Physical Exam  Triage Vital Signs: ED Triage Vitals [11/18/22 1357]  Encounter Vitals Group     BP 110/71     Systolic BP Percentile      Diastolic BP Percentile      Pulse Rate (!) 109     Resp 17     Temp 98 F (36.7 C)     Temp Source Oral     SpO2 98 %     Weight 93 lb (42.2 kg)     Height 5\' 2"  (1.575 m)     Head Circumference      Peak Flow      Pain Score 5     Pain Loc      Pain Education      Exclude from Growth Chart    Most recent vital signs: Vitals:   11/18/22 1357  BP: 110/71  Pulse: (!) 109  Resp: 17  Temp: 98 F (36.7 C)  SpO2: 98%   General: Awake, oriented x4. CV:  Good peripheral perfusion.  Resp:  Normal effort.  Abd:  No distention.  Nontender to palpation Other:  Young adult well-developed, well-nourished African-American female resting comfortably in no acute distress ED Results / Procedures / Treatments  Labs (all labs ordered are listed, but only abnormal results are displayed) Labs Reviewed  COMPREHENSIVE METABOLIC PANEL - Abnormal; Notable for the following components:      Result Value   Potassium 3.4 (*)    Total Bilirubin 2.8 (*)    All other components within normal  limits  CBC - Abnormal; Notable for the following components:   RDW 11.4 (*)    All other components within normal limits  URINALYSIS, ROUTINE W REFLEX MICROSCOPIC - Abnormal; Notable for the following components:   Color, Urine DARK (*)    APPearance CLEAR (*)    Hgb urine dipstick MODERATE (*)    Bilirubin Urine SMALL (*)    Ketones, ur 15 (*)    Protein, ur TRACE (*)    All other components within normal limits  LIPASE, BLOOD  POC URINE PREG, ED   PROCEDURES: Critical Care performed: No .1-3 Lead EKG Interpretation  Performed by: Merwyn Katos, MD Authorized by: Merwyn Katos, MD     Interpretation: normal     ECG rate:  91   ECG rate assessment: normal     Rhythm: sinus rhythm     Ectopy: none     Conduction: normal    MEDICATIONS ORDERED IN ED: Medications - No data to display IMPRESSION / MDM / ASSESSMENT AND PLAN / ED COURSE  I reviewed the triage vital signs and the nursing notes.  The patient is on the cardiac monitor to evaluate for evidence of arrhythmia and/or significant heart rate changes. Patient's presentation is most consistent with acute presentation with potential threat to life or bodily function. Presentation most consistent with Viral Syndrome.  Patient has tested positive for COVID-19. Based on vitals and exam they are nontoxic and stable for discharge.  Given History and Exam I have a lower suspicion for: Emergent CardioPulmonary causes [such as Acute Asthma or COPD Exacerbation, acute Heart Failure or exacerbation, PE, PTX, atypical ACS, PNA]. Emergent Otolaryngeal causes [such as PTA, RPA, Ludwigs, Epiglottitis, EBV].  Regarding Emergent Travel or Immunosuppressive related infectious: I have a low suspicion for acute HIV.  Will provide strict return precautions and instructions on self-isolation/quarantine and anticipatory guidance.  Dispo: Discharge home   FINAL CLINICAL IMPRESSION(S) / ED DIAGNOSES   Final  diagnoses:  Generalized abdominal pain  Nausea and vomiting, unspecified vomiting type  COVID-19 virus infection   Rx / DC Orders   ED Discharge Orders          Ordered    ondansetron (ZOFRAN-ODT) 8 MG disintegrating tablet  Every 8 hours PRN        11/18/22 1551           Note:  This document was prepared using Dragon voice recognition software and may include unintentional dictation errors.   Merwyn Katos, MD 11/18/22 1600

## 2022-11-18 NOTE — ED Notes (Signed)
Has puked 3x in the last 24 hrs when she eats something, can't keep anything down.

## 2022-12-25 ENCOUNTER — Encounter (HOSPITAL_COMMUNITY): Payer: Self-pay | Admitting: *Deleted

## 2022-12-25 ENCOUNTER — Ambulatory Visit (HOSPITAL_COMMUNITY)
Admission: EM | Admit: 2022-12-25 | Discharge: 2022-12-25 | Disposition: A | Discharge status: Home / Self Care | Payer: Medicaid Other

## 2022-12-25 DIAGNOSIS — S61211A Laceration without foreign body of left index finger without damage to nail, initial encounter: Secondary | ICD-10-CM | POA: Diagnosis not present

## 2022-12-25 MED ORDER — LIDOCAINE HCL (PF) 2 % IJ SOLN
INTRAMUSCULAR | Status: AC
  Filled 2022-12-25: qty 5

## 2022-12-25 NOTE — ED Triage Notes (Signed)
Pt states she cut her left pointer finger with hair scissors at 3pm today. The wound is still bleeding per pt. Last TDAP 10/29/2019

## 2022-12-25 NOTE — Discharge Instructions (Signed)
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
## 2023-01-07 ENCOUNTER — Encounter (HOSPITAL_COMMUNITY): Payer: Self-pay

## 2023-01-07 ENCOUNTER — Emergency Department (HOSPITAL_COMMUNITY)
Admission: EM | Admit: 2023-01-07 | Discharge: 2023-01-07 | Disposition: A | Discharge status: Home / Self Care | Payer: Medicaid Other | Attending: Emergency Medicine | Admitting: Emergency Medicine

## 2023-01-07 DIAGNOSIS — Z4802 Encounter for removal of sutures: Secondary | ICD-10-CM | POA: Insufficient documentation

## 2023-01-07 NOTE — ED Triage Notes (Signed)
Left pointer (2nd) finger needs suture removal. Sutured 10 days ago. Appears clean, dry and intact.

## 2023-01-07 NOTE — Discharge Instructions (Signed)
Return for new or worsening symptoms such as redness, severe pain or signs of infection.

## 2023-01-07 NOTE — ED Provider Notes (Signed)
[place] EMERGENCY DEPARTMENT AT [facility] Provider Note   CSN: [id number] Arrival date & time: 01/07/23  4098     History  Chief Complaint  Patient presents with   Suture / Staple Removal    Michaela Gibson is a 22 y.o. female.   Suture / Staple Removal  22 year old female present for suture removal.  On October 8 she went to urgent care for a cut on her left index finger.  2 stitches placed.  Healing well.  No redness, discharge, bleeding.  No pain.     Home Medications Prior to Admission medications   Medication Sig Start Date End Date Taking? Authorizing Provider  ibuprofen (ADVIL) 600 MG tablet Take 1 tablet (600 mg total) by mouth every 6 (six) hours. Patient not taking: Reported on 03/23/2020 01/08/20   Jacklyn Shell, CNM  levonorgestrel (MIRENA) 20 MCG/DAY IUD 1 each by Intrauterine route once.    [provider]  metroNIDAZOLE (FLAGYL) 500 MG tablet Take 1 tablet (500 mg total) by mouth 2 (two) times daily. 12/14/20   Brock Bad, MD  naproxen (NAPROSYN) 500 MG tablet Take 1 tablet (500 mg total) by mouth 2 (two) times daily with a meal. 12/20/21   Daphine Deutscher, Mary-Margaret, FNP  ondansetron (ZOFRAN-ODT) 8 MG disintegrating tablet Take 1 tablet (8 mg total) by mouth every 8 (eight) hours as needed for nausea or vomiting. 11/18/22   Merwyn Katos, MD  penicillin v potassium (VEETID) 500 MG tablet Take 1 tablet (500 mg total) by mouth 4 (four) times daily. 12/19/21   Roxy Horseman, PA-C  Prenatal MV-Min-FA-Omega-3 (PRENATAL GUMMIES/DHA & FA) 0.4-32.5 MG CHEW Chew by mouth. Patient not taking: Reported on 03/23/2020    [provider]  traMADol (ULTRAM) 50 MG tablet Take 1 tablet (50 mg total) by mouth every 6 (six) hours as needed. 11/17/21   Ellsworth Lennox, PA-C      Allergies    Patient has no active allergies.    Review of Systems   Review of Systems Review of systems completed and notable as per HPI.  ROS otherwise  negative.   Physical Exam Updated Vital Signs BP 126/83   Pulse 80   Temp (!) 97.4 F (36.3 C) (Oral)   Resp 18   Ht 5\' 2"  (1.575 m)   Wt 42.2 kg   LMP  (Exact Date)   SpO2 100%   BMI 17.02 kg/m  Physical Exam Vitals and nursing note reviewed.  Constitutional:      General: She is not in acute distress.    Appearance: She is well-developed.  HENT:     Head: Normocephalic and atraumatic.  Cardiovascular:     Rate and Rhythm: Normal rate and regular rhythm.     Heart sounds: No murmur heard. Pulmonary:     Effort: Pulmonary effort is normal. No respiratory distress.     Breath sounds: Normal breath sounds.  Musculoskeletal:        General: No swelling.     Cervical back: Neck supple.  Skin:    General: Skin is warm and dry.     Capillary Refill: Capillary refill takes less than 2 seconds.     Comments: Healed laceration to the distal left index finger.  2 stitches present.  Normal capillary refill.  No erythema, fluctuance, swelling.  Neurological:     Mental Status: She is alert.  Psychiatric:        Mood and Affect: Mood normal.     ED  Results / Procedures / Treatments   Labs (all labs ordered are listed, but only abnormal results are displayed) Labs Reviewed - No data to display  EKG None  Radiology No results found.  Procedures .Suture Removal  Date/Time: 01/07/2023 9:07 AM  Performed by: Laurence Spates, MD Authorized by: Laurence Spates, MD   Consent:    Consent obtained:  Verbal   Risks discussed:  Bleeding, pain and wound separation Universal protocol:    Immediately prior to procedure, a time out was called: yes     Patient identity confirmed:  Verbally with patient Location:    Location:  Upper extremity Procedure details:    Wound appearance:  Good wound healing, no signs of infection and clean   Number of sutures removed:  2 Post-procedure details:    Post-removal:  No dressing applied   Procedure completion:  Tolerated well, no  immediate complications     Medications Ordered in ED Medications - No data to display  ED Course/ Medical Decision Making/ A&P                                 Medical Decision Making  Medical Decision Making:   Michaela Gibson is a 22 y.o. female who presented to the ED today with suture removal.  Vital signs reviewed.  Laceration healing well, no signs of infection.  Sutures removed without issue.  Recommend PCP follow-up.  Return precautions given.  Patient's presentation is most consistent with acute, uncomplicated illness.           Final Clinical Impression(s) / ED Diagnoses Final diagnoses:  Visit for suture removal    Rx / DC Orders ED Discharge Orders     None         Laurence Spates, MD 01/07/23 [phone]

## 2023-01-21 ENCOUNTER — Encounter: Payer: Self-pay | Admitting: *Deleted

## 2023-01-21 ENCOUNTER — Ambulatory Visit
Admission: EM | Admit: 2023-01-21 | Discharge: 2023-01-21 | Disposition: A | Discharge status: Home / Self Care | Payer: Medicaid Other | Attending: Emergency Medicine | Admitting: Emergency Medicine

## 2023-01-21 DIAGNOSIS — Z3202 Encounter for pregnancy test, result negative: Secondary | ICD-10-CM | POA: Diagnosis present

## 2023-01-21 DIAGNOSIS — Z113 Encounter for screening for infections with a predominantly sexual mode of transmission: Secondary | ICD-10-CM | POA: Diagnosis not present

## 2023-01-21 LAB — POCT URINE PREGNANCY: Preg Test, Ur: NEGATIVE

## 2023-01-21 NOTE — ED Triage Notes (Signed)
Patient states she would like STI testing, no known exposure.  Concerned for pregnancy wants testing, breast soreness, lower abd pain, nausea w/o vomiting

## 2023-01-21 NOTE — Discharge Instructions (Signed)
Urine Pregnancy test is negative  Labs pending 2-3 days, you will be contacted if positive for any sti and treatment will be sent to the pharmacy, you will have to return to the clinic if positive for gonorrhea to receive treatment   Please refrain from having sex until labs results, if positive please refrain from having sex until treatment complete and symptoms resolve   If positive for HIV, Syphilis, Chlamydia  gonorrhea or trichomoniasis please notify partner or partners so they may tested as well  Moving forward, it is recommended you use some form of protection against the transmission of sti infections  such as condoms or dental dams with each sexual encounter
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
## 2023-01-22 LAB — CERVICOVAGINAL ANCILLARY ONLY
Bacterial Vaginitis (gardnerella): POSITIVE — AB
Candida Glabrata: NEGATIVE
Candida Vaginitis: POSITIVE — AB
Chlamydia: NEGATIVE
Comment: NEGATIVE
Comment: NEGATIVE
Comment: NEGATIVE
Comment: NEGATIVE
Comment: NEGATIVE
Comment: NORMAL
Neisseria Gonorrhea: NEGATIVE
Trichomonas: POSITIVE — AB

## 2023-01-22 LAB — HIV ANTIBODY (ROUTINE TESTING W REFLEX): HIV Screen 4th Generation wRfx: NONREACTIVE

## 2023-01-22 LAB — RPR: RPR Ser Ql: NONREACTIVE

## 2023-01-25 ENCOUNTER — Telehealth (HOSPITAL_COMMUNITY): Payer: Self-pay | Admitting: Emergency Medicine

## 2023-01-25 MED ORDER — METRONIDAZOLE 500 MG PO TABS: 500.0000 mg | ORAL_TABLET | Freq: Two times a day (BID) | ORAL | 0 refills | Status: DC

## 2023-01-25 MED ORDER — FLUCONAZOLE 150 MG PO TABS: 150.0000 mg | ORAL_TABLET | Freq: Once | ORAL | 0 refills | Status: AC

## 2023-01-25 NOTE — Telephone Encounter (Signed)
Metronidazole for BV and Trichomonas Diflucan for candida

## 2023-02-10 ENCOUNTER — Other Ambulatory Visit: Payer: Self-pay

## 2023-02-10 ENCOUNTER — Emergency Department: Payer: No Typology Code available for payment source

## 2023-02-10 ENCOUNTER — Emergency Department
Admission: EM | Admit: 2023-02-10 | Discharge: 2023-02-10 | Disposition: A | Discharge status: Home / Self Care | Payer: No Typology Code available for payment source | Attending: Emergency Medicine | Admitting: Emergency Medicine

## 2023-02-10 DIAGNOSIS — S0990XA Unspecified injury of head, initial encounter: Secondary | ICD-10-CM | POA: Diagnosis not present

## 2023-02-10 DIAGNOSIS — Y92410 Unspecified street and highway as the place of occurrence of the external cause: Secondary | ICD-10-CM | POA: Diagnosis not present

## 2023-02-10 DIAGNOSIS — R519 Headache, unspecified: Secondary | ICD-10-CM | POA: Diagnosis present

## 2023-02-10 MED ORDER — ACETAMINOPHEN 325 MG PO TABS
650.0000 mg | ORAL_TABLET | Freq: Once | ORAL | Status: AC
  Administered 2023-02-10: 650 mg via ORAL
  Filled 2023-02-10: qty 2
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
## 2023-02-10 NOTE — Discharge Instructions (Signed)
Your head CT was normal.  You may continue to take Tylenol/ibuprofen per package instructions as needed for pain.  Please return for any new, worsening, or change in symptoms or other concerns.  It was a pleasure caring for you today.

## 2023-06-25 ENCOUNTER — Emergency Department (HOSPITAL_COMMUNITY)
Admission: EM | Admit: 2023-06-25 | Discharge: 2023-06-26 | Discharge status: Against Medical Advice | Attending: Emergency Medicine | Admitting: Emergency Medicine

## 2023-06-25 ENCOUNTER — Encounter (HOSPITAL_COMMUNITY): Payer: Self-pay

## 2023-06-25 ENCOUNTER — Other Ambulatory Visit: Payer: Self-pay

## 2023-06-25 DIAGNOSIS — R197 Diarrhea, unspecified: Secondary | ICD-10-CM | POA: Insufficient documentation

## 2023-06-25 DIAGNOSIS — R1031 Right lower quadrant pain: Secondary | ICD-10-CM | POA: Diagnosis present

## 2023-06-25 DIAGNOSIS — R1032 Left lower quadrant pain: Secondary | ICD-10-CM | POA: Diagnosis not present

## 2023-06-25 DIAGNOSIS — R112 Nausea with vomiting, unspecified: Secondary | ICD-10-CM | POA: Diagnosis not present

## 2023-06-25 DIAGNOSIS — Z5321 Procedure and treatment not carried out due to patient leaving prior to being seen by health care provider: Secondary | ICD-10-CM | POA: Diagnosis not present

## 2023-06-25 LAB — COMPREHENSIVE METABOLIC PANEL WITH GFR
ALT: 12 U/L (ref 0–44)
AST: 20 U/L (ref 15–41)
Albumin: 3.8 g/dL (ref 3.5–5.0)
Alkaline Phosphatase: 44 U/L (ref 38–126)
Anion gap: 8 (ref 5–15)
BUN: 12 mg/dL (ref 6–20)
CO2: 24 mmol/L (ref 22–32)
Calcium: 8.9 mg/dL (ref 8.9–10.3)
Chloride: 105 mmol/L (ref 98–111)
Creatinine, Ser: 0.66 mg/dL (ref 0.44–1.00)
GFR, Estimated: >60 mL/min (ref >60)
Glucose, Bld: 112 mg/dL — ABNORMAL HIGH (ref 70–99)
Potassium: 3.7 mmol/L (ref 3.5–5.1)
Sodium: 137 mmol/L (ref 135–145)
Total Bilirubin: 2.4 mg/dL — ABNORMAL HIGH (ref 0.0–1.2)
Total Protein: 6.5 g/dL (ref 6.5–8.1)

## 2023-06-25 LAB — URINALYSIS, ROUTINE W REFLEX MICROSCOPIC
Bilirubin Urine: NEGATIVE
Glucose, UA: NEGATIVE mg/dL
Ketones, ur: NEGATIVE mg/dL
Nitrite: NEGATIVE
Protein, ur: NEGATIVE mg/dL
Specific Gravity, Urine: 1.026 (ref 1.005–1.030)
pH: 6 (ref 5.0–8.0)

## 2023-06-25 LAB — CBC
HCT: 38.6 % (ref 36.0–46.0)
Hemoglobin: 12.4 g/dL (ref 12.0–15.0)
MCH: 29.5 pg (ref 26.0–34.0)
MCHC: 32.1 g/dL (ref 30.0–36.0)
MCV: 91.7 fL (ref 80.0–100.0)
Platelets: 191 10*3/uL (ref 150–400)
RBC: 4.21 MIL/uL (ref 3.87–5.11)
RDW: 11.3 % — ABNORMAL LOW (ref 11.5–15.5)
WBC: 7.9 10*3/uL (ref 4.0–10.5)
nRBC: 0 % (ref 0.0–0.2)

## 2023-06-25 LAB — LIPASE, BLOOD: Lipase: 35 U/L (ref 11–51)

## 2023-06-25 LAB — HCG, SERUM, QUALITATIVE: Preg, Serum: NEGATIVE

## 2023-06-25 NOTE — ED Triage Notes (Signed)
 Pt c/o RLQ, LLQ abdominal pain, N/V/D started yesterday

## 2023-06-26 NOTE — ED Notes (Signed)
Called for VS, no response. °

## 2023-07-31 ENCOUNTER — Encounter (HOSPITAL_COMMUNITY): Payer: Self-pay | Admitting: Emergency Medicine

## 2023-07-31 ENCOUNTER — Emergency Department (HOSPITAL_COMMUNITY)

## 2023-07-31 ENCOUNTER — Emergency Department (HOSPITAL_COMMUNITY)
Admission: EM | Admit: 2023-07-31 | Discharge: 2023-07-31 | Disposition: A | Discharge status: Home / Self Care | Attending: Emergency Medicine | Admitting: Emergency Medicine

## 2023-07-31 ENCOUNTER — Other Ambulatory Visit: Payer: Self-pay

## 2023-07-31 DIAGNOSIS — X58XXXA Exposure to other specified factors, initial encounter: Secondary | ICD-10-CM | POA: Insufficient documentation

## 2023-07-31 DIAGNOSIS — J45909 Unspecified asthma, uncomplicated: Secondary | ICD-10-CM | POA: Insufficient documentation

## 2023-07-31 DIAGNOSIS — S6992XA Unspecified injury of left wrist, hand and finger(s), initial encounter: Secondary | ICD-10-CM | POA: Insufficient documentation

## 2023-07-31 NOTE — Discharge Instructions (Signed)
 Your x-ray today was negative for any fractures.  Continue to ice, wear the splint as needed, take ibuprofen  for pain.  If your pain persists, please follow-up with the primary care doctor as you may need more imaging/possible physical therapy.  Return to the ER for any new or worsening symptoms.
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
## 2023-08-22 ENCOUNTER — Encounter: Payer: Self-pay | Admitting: Emergency Medicine

## 2023-08-22 ENCOUNTER — Other Ambulatory Visit: Payer: Self-pay

## 2023-08-22 ENCOUNTER — Emergency Department
Admission: EM | Admit: 2023-08-22 | Discharge: 2023-08-22 | Disposition: A | Discharge status: Home / Self Care | Attending: Emergency Medicine | Admitting: Emergency Medicine

## 2023-08-22 DIAGNOSIS — J45909 Unspecified asthma, uncomplicated: Secondary | ICD-10-CM | POA: Diagnosis not present

## 2023-08-22 DIAGNOSIS — D649 Anemia, unspecified: Secondary | ICD-10-CM | POA: Insufficient documentation

## 2023-08-22 DIAGNOSIS — N12 Tubulo-interstitial nephritis, not specified as acute or chronic: Secondary | ICD-10-CM | POA: Insufficient documentation

## 2023-08-22 DIAGNOSIS — R110 Nausea: Secondary | ICD-10-CM | POA: Diagnosis present

## 2023-08-22 LAB — URINALYSIS, ROUTINE W REFLEX MICROSCOPIC
Bilirubin Urine: NEGATIVE
Glucose, UA: NEGATIVE mg/dL
Ketones, ur: NEGATIVE mg/dL
Nitrite: NEGATIVE
Protein, ur: 30 mg/dL — AB
Specific Gravity, Urine: 1.019 (ref 1.005–1.030)
WBC, UA: >50 WBC/hpf (ref 0–5)
pH: 7 (ref 5.0–8.0)

## 2023-08-22 LAB — COMPREHENSIVE METABOLIC PANEL WITH GFR
ALT: 10 U/L (ref 0–44)
AST: 18 U/L (ref 15–41)
Albumin: 4.1 g/dL (ref 3.5–5.0)
Alkaline Phosphatase: 50 U/L (ref 38–126)
Anion gap: 7 (ref 5–15)
BUN: 18 mg/dL (ref 6–20)
CO2: 27 mmol/L (ref 22–32)
Calcium: 8.8 mg/dL — ABNORMAL LOW (ref 8.9–10.3)
Chloride: 104 mmol/L (ref 98–111)
Creatinine, Ser: 0.69 mg/dL (ref 0.44–1.00)
GFR, Estimated: >60 mL/min (ref >60)
Glucose, Bld: 88 mg/dL (ref 70–99)
Potassium: 3.5 mmol/L (ref 3.5–5.1)
Sodium: 138 mmol/L (ref 135–145)
Total Bilirubin: 1.9 mg/dL — ABNORMAL HIGH (ref 0.0–1.2)
Total Protein: 7.1 g/dL (ref 6.5–8.1)

## 2023-08-22 LAB — LIPASE, BLOOD: Lipase: 36 U/L (ref 11–51)

## 2023-08-22 LAB — CBC
HCT: 36.8 % (ref 36.0–46.0)
Hemoglobin: 11.7 g/dL — ABNORMAL LOW (ref 12.0–15.0)
MCH: 29.6 pg (ref 26.0–34.0)
MCHC: 31.8 g/dL (ref 30.0–36.0)
MCV: 93.2 fL (ref 80.0–100.0)
Platelets: 189 10*3/uL (ref 150–400)
RBC: 3.95 MIL/uL (ref 3.87–5.11)
RDW: 11.1 % — ABNORMAL LOW (ref 11.5–15.5)
WBC: 8.6 10*3/uL (ref 4.0–10.5)
nRBC: 0 % (ref 0.0–0.2)

## 2023-08-22 LAB — POC URINE PREG, ED: Preg Test, Ur: NEGATIVE

## 2023-08-22 MED ORDER — LIDOCAINE HCL (PF) 1 % IJ SOLN
INTRAMUSCULAR | Status: AC
  Filled 2023-08-22: qty 5

## 2023-08-22 MED ORDER — CEFPODOXIME PROXETIL 200 MG PO TABS: 200.0000 mg | ORAL_TABLET | Freq: Two times a day (BID) | ORAL | 0 refills | Status: AC

## 2023-08-22 MED ORDER — CEFTRIAXONE SODIUM 1 G IJ SOLR
1.0000 g | Freq: Once | INTRAMUSCULAR | Status: AC
  Administered 2023-08-22: 1 g via INTRAMUSCULAR
  Filled 2023-08-22: qty 10

## 2023-08-22 MED ORDER — SODIUM CHLORIDE 0.9 % IV BOLUS
1000.0000 mL | Freq: Once | INTRAVENOUS | Status: AC
  Administered 2023-08-22: 1000 mL via INTRAVENOUS

## 2023-08-22 NOTE — ED Provider Notes (Signed)
 [facility] Provider Note    Event Date/Time   First MD Initiated Contact with Patient 08/22/23 1016     (approximate)   History   Nausea   HPI  Michaela Gibson is a 23 y.o. female with PMH of asthma presents for evaluation of back pain and nausea that began yesterday.  Patient also endorses urinary symptoms including dysuria for 1 week.  Patient denies vomiting and fever.      Physical Exam   Triage Vital Signs: ED Triage Vitals [08/22/23 0946]  Encounter Vitals Group     BP (!) 95/58     Systolic BP Percentile      Diastolic BP Percentile      Pulse Rate 66     Resp 17     Temp 98.5 F (36.9 C)     Temp Source Oral     SpO2 100 %     Weight 93 lb 0.6 oz (42.2 kg)     Height 5\' 2"  (1.575 m)     Head Circumference      Peak Flow      Pain Score 8     Pain Loc      Pain Education      Exclude from Growth Chart     Most recent vital signs: Vitals:   08/22/23 0946  BP: (!) 95/58  Pulse: 66  Resp: 17  Temp: 98.5 F (36.9 C)  SpO2: 100%   General: Awake, no distress.  CV:  Good peripheral perfusion.  RRR. Resp:  Normal effort.  CTAB. Abd:  No distention.  Bilateral CVA tenderness. Other:     ED Results / Procedures / Treatments   Labs (all labs ordered are listed, but only abnormal results are displayed) Labs Reviewed  COMPREHENSIVE METABOLIC PANEL WITH GFR - Abnormal; Notable for the following components:      Result Value   Calcium 8.8 (*)    Total Bilirubin 1.9 (*)    All other components within normal limits  CBC - Abnormal; Notable for the following components:   Hemoglobin 11.7 (*)    RDW 11.1 (*)    All other components within normal limits  URINALYSIS, ROUTINE W REFLEX MICROSCOPIC - Abnormal; Notable for the following components:   Color, Urine YELLOW (*)    APPearance HAZY (*)    Hgb urine dipstick SMALL (*)    Protein, ur 30 (*)    Leukocytes,Ua SMALL (*)    Bacteria, UA RARE (*)    All other components  within normal limits  LIPASE, BLOOD  POC URINE PREG, ED    PROCEDURES:  Critical Care performed: No  Procedures   MEDICATIONS ORDERED IN ED: Medications  cefTRIAXone (ROCEPHIN) injection 1 g (has no administration in time range)  sodium chloride  0.9 % bolus 1,000 mL (has no administration in time range)     IMPRESSION / MDM / ASSESSMENT AND PLAN / ED COURSE  I reviewed the triage vital signs and the nursing notes.                             23 year old female presents for evaluation of bilateral flank pain and dysuria.  Patient is a little hypotensive otherwise vital signs are stable.  Patient NAD on exam.  Differential diagnosis includes, but is not limited to, pyelonephritis, nephrolithiasis, UTI, muscle strain.  Patient's presentation is most consistent with acute complicated illness / injury requiring diagnostic workup.  Pregnancy test is negative.  CBC shows mild anemia which is consistent with patient's baseline..  CMP shows slightly elevated bilirubin but this is also consistent with patient's baseline.  Lipase unremarkable.  UA shows presence of hemoglobin, protein, leukocytes with greater than 50 WBCs and bacteria, this is consistent with a UTI. Low suspicion for nephrolithiasis as patient has not had this before and pain is bilateral with dysuria. Did consider STI as potential cause but no abnormal vaginal discharge. Given that patient is reporting back pain and nausea I am concerned about pyelonephritis although she has not had a fever.  Will start her on oral antibiotics and give dose of IV antibiotics here.   Patient voiced understanding, all questions were answered and she was stable at discharge.     FINAL CLINICAL IMPRESSION(S) / ED DIAGNOSES   Final diagnoses:  Pyelonephritis     Rx / DC Orders   ED Discharge Orders          Ordered    cefpodoxime (VANTIN) 200 MG tablet  2 times daily        08/22/23 1123             Note:  This document  was prepared using Dragon voice recognition software and may include unintentional dictation errors.   Phyliss Breen, PA-C 08/22/23 1126    Lind Repine, MD 08/22/23 [phone]

## 2023-08-22 NOTE — ED Notes (Signed)
 See triage note  Presents with some nausea  and bilateral lower back pain  States sx's started 2 days ago  Afebrile on arrival

## 2023-08-22 NOTE — ED Triage Notes (Signed)
 Pt here with back pain and nausea since yesterday. Pt denies abd pain. Pt endorses bilateral flank pain. Pt denies vomiting.

## 2023-08-22 NOTE — Discharge Instructions (Addendum)
 Please take the antibiotics as prescribed.  You can take 650 mg of Tylenol  every 6 hours as needed for pain. You can use ice, heat, muscle creams and other topical pain relievers as well.

## 2023-12-22 ENCOUNTER — Encounter: Payer: Self-pay | Admitting: Emergency Medicine

## 2023-12-22 ENCOUNTER — Emergency Department
Admission: EM | Admit: 2023-12-22 | Discharge: 2023-12-22 | Disposition: A | Discharge status: Home / Self Care | Attending: Emergency Medicine | Admitting: Emergency Medicine

## 2023-12-22 ENCOUNTER — Other Ambulatory Visit: Payer: Self-pay

## 2023-12-22 DIAGNOSIS — K5900 Constipation, unspecified: Secondary | ICD-10-CM | POA: Insufficient documentation

## 2023-12-22 DIAGNOSIS — K644 Residual hemorrhoidal skin tags: Secondary | ICD-10-CM | POA: Insufficient documentation

## 2023-12-22 DIAGNOSIS — J45909 Unspecified asthma, uncomplicated: Secondary | ICD-10-CM | POA: Insufficient documentation

## 2023-12-22 DIAGNOSIS — K6289 Other specified diseases of anus and rectum: Secondary | ICD-10-CM | POA: Diagnosis present

## 2023-12-22 MED ORDER — POLYETHYLENE GLYCOL 3350 17 G PO PACK: 17.0000 g | PACK | Freq: Every day | ORAL | 0 refills | Status: DC

## 2023-12-22 MED ORDER — DOCUSATE SODIUM 100 MG PO CAPS: 100.0000 mg | ORAL_CAPSULE | Freq: Every day | ORAL | 0 refills | Status: AC | PRN

## 2023-12-22 MED ORDER — PHENYLEPHRINE IN HARD FAT 0.25 % RE SUPP: 1.0000 | Freq: Two times a day (BID) | RECTAL | 0 refills | Status: DC | PRN

## 2023-12-22 MED ORDER — HYDROCORTISONE ACETATE 25 MG RE SUPP: 25.0000 mg | Freq: Every day | RECTAL | 0 refills | Status: AC | PRN

## 2023-12-22 NOTE — ED Provider Notes (Signed)
 [facility] Provider Note    Event Date/Time   First MD Initiated Contact with Patient 12/22/23 2206     (approximate)   History   Hemorrhoids   HPI  Jozee Hammer is a 23 y.o. female  with a past medical history of asthma presents to the emergency department with rectal pain x 4 days.  Patient denies nausea, vomiting, diarrhea, fever, abdominal pain, back pain, dysuria, vaginal discharge, vaginal bleeding or rectal bleeding.  Patient states she does have a history of hemorrhoids, but her pain has not been as significant as this current time.  Patient has not tried anything at home for her pain.  Patient does not sit on the toilet for extended periods of time.  Patient states she has not been able to defecate since Thursday.    Physical Exam   Triage Vital Signs: ED Triage Vitals  Encounter Vitals Group     BP 12/22/23 2130 (!) 119/56     Girls Systolic BP Percentile --      Girls Diastolic BP Percentile --      Boys Systolic BP Percentile --      Boys Diastolic BP Percentile --      Pulse Rate 12/22/23 2130 83     Resp 12/22/23 2130 16     Temp 12/22/23 2130 98.5 F (36.9 C)     Temp Source 12/22/23 2130 Oral     SpO2 12/22/23 2130 100 %     Weight 12/22/23 2131 93 lb (42.2 kg)     Height 12/22/23 2131 5' 2 (1.575 m)     Head Circumference --      Peak Flow --      Pain Score 12/22/23 2131 7     Pain Loc --      Pain Education --      Exclude from Growth Chart --     Most recent vital signs: Vitals:   12/22/23 2130 12/22/23 2259  BP: (!) 119/56 118/60  Pulse: 83 80  Resp: 16 16  Temp: 98.5 F (36.9 C) 98 F (36.7 C)  SpO2: 100% 100%    General: Awake, in no acute distress. Appears stated age. CV: Good peripheral perfusion.  Respiratory:Normal respiratory effort.  No respiratory distress.  GI: Soft, non-distended, non-tender.  Skin:Warm, dry, intact. No rashes, lesions, or ecchymosis. No cyanosis or pallor. No CVA tenderness b/l  or midline lumbar tenderness. Chaperone RN present for DRE. Patient has pain on DRE, external hemorrhoid without overlying skin changes noted at the 6 o'clock position. Anal sphincter tone intact.  ED Results / Procedures / Treatments   Labs (all labs ordered are listed, but only abnormal results are displayed) Labs Reviewed - No data to display   EKG     RADIOLOGY    PROCEDURES:  Critical Care performed: No   Procedures   MEDICATIONS ORDERED IN ED: Medications - No data to display   IMPRESSION / MDM / ASSESSMENT AND PLAN / ED COURSE  I reviewed the triage vital signs and the nursing notes.                              Differential diagnosis includes, but is not limited to, external hemorrhoid, internal hemorrhoid, constipation, anal fissure  Patient's presentation is most consistent with acute, uncomplicated illness.  Patient is a 23 year old female with signs and symptoms as described above.  On physical exam, she has an  external hemorrhoid without any overlying skin changes at the 6 o'clock position and pain on digital rectal exam.  No rectal bleeding on digital rectal exam. Afebrile, no abdominal pain, N/V/D, and well-appearing at this time.  No evidence of strangulation at this time. Discussed I will send her home with stool softener, MiraLAX, and Anusol and Preparation H suppositories to help with her pain and constipation.  Discussed high fiber diet, sitz baths, and no straining. She is to follow-up with our general surgery team outpatient as discussed.   She may follow up in the ER for any abdominal pain, significant rectal bleeding or for any new, worsening, or concerning symptoms. Patient was given the opportunity to ask questions; all questions were answered. Emergency department return precautions were discussed with the patient.  Patient is in agreement to the treatment plan.  Patient is stable for discharge.    FINAL CLINICAL IMPRESSION(S) / ED DIAGNOSES    Final diagnoses:  Constipation, unspecified constipation type  External hemorrhoid     Rx / DC Orders   ED Discharge Orders          Ordered    phenylephrine (,USE FOR PREPARATION-H,) 0.25 % suppository  2 times daily PRN        12/22/23 2255    hydrocortisone (ANUSOL-HC) 25 MG suppository  Daily PRN        12/22/23 2255    polyethylene glycol (MIRALAX) 17 g packet  Daily        12/22/23 2255    docusate sodium  (COLACE) 100 MG capsule  Daily PRN        12/22/23 2255             Note:  This document was prepared using Dragon voice recognition software and may include unintentional dictation errors.     Sheron Salm, PA-C 12/22/23 2320    Viviann Pastor, MD 12/22/23 [phone]

## 2023-12-22 NOTE — ED Triage Notes (Signed)
 Pt arrives from home POV, C/O hemorrhoid rectal pain/flare up.  Denies bleeding.

## 2023-12-22 NOTE — Discharge Instructions (Addendum)
 As we discussed, you are suffering from hemorrhoids.  There is no immediate fix for them, but please read through the included information for recommendations.  Sitting in warm water (Sitz baths) can help a lot.  You can also use over-the-counter ointments and creams (I have prescribed them for you), a stool softener, and medication to help you poop.  You should also consider trying a high-fiber diet.  Please fill any prescriptions you may have been provided and use them as recommended on the label instructions.  Follow-up with the general surgeon (Dr. Jordis) listed for an outpatient follow-up visit to discuss additional treatment options.  Remember that hemorrhoids can bleed and this is frequently normal, but if the bleeding becomes severe, you may need to return to the emergency department for evaluation.  Also return to the emergency department if you develop new or worsening symptoms that concern you.

## 2024-01-04 ENCOUNTER — Encounter (HOSPITAL_COMMUNITY): Payer: Self-pay

## 2024-01-04 ENCOUNTER — Ambulatory Visit (HOSPITAL_COMMUNITY)
Admission: EM | Admit: 2024-01-04 | Discharge: 2024-01-04 | Disposition: A | Discharge status: Home / Self Care | Attending: Family Medicine | Admitting: Family Medicine

## 2024-01-04 DIAGNOSIS — Z113 Encounter for screening for infections with a predominantly sexual mode of transmission: Secondary | ICD-10-CM | POA: Insufficient documentation

## 2024-01-04 LAB — HIV ANTIBODY (ROUTINE TESTING W REFLEX): HIV Screen 4th Generation wRfx: NONREACTIVE

## 2024-01-04 NOTE — Discharge Instructions (Signed)
 We have sent testing for sexually transmitted infections. We will notify you of any positive results once they are received. If required, we will prescribe any medications you might need.  Please refrain from all sexual activity for at least the next seven days.

## 2024-01-04 NOTE — ED Provider Notes (Signed)
 [facility]   [id number] 01/04/24 Arrival Time: 1435  ASSESSMENT & PLAN:  1. Screening for STDs (sexually transmitted diseases)       Discharge Instructions      We have sent testing for sexually transmitted infections. We will notify you of any positive results once they are received. If required, we will prescribe any medications you might need.  Please refrain from all sexual activity for at least the next seven days.     Without s/s of PID.  Labs Reviewed  HIV ANTIBODY (ROUTINE TESTING W REFLEX)  RPR  CERVICOVAGINAL ANCILLARY ONLY   Will notify of any positive results. Instructed to refrain from sexual activity for at least seven days.  Reviewed expectations re: course of current medical issues. Questions answered. Outlined signs and symptoms indicating need for more acute intervention. Patient verbalized understanding. After Visit Summary given.   SUBJECTIVE:  Michaela Gibson is a 23 y.o. female who requests STD testing. No symptoms.  No LMP recorded. (Menstrual status: IUD).   OBJECTIVE:  Vitals:   01/04/24 1515  BP: (!) 108/53  Pulse: 78  Resp: 20  Temp: 98.7 F (37.1 C)  TempSrc: Oral  SpO2: 98%     General appearance: alert, cooperative, appears stated age and no distress GU: deferred Skin: warm and dry Psychological: alert and cooperative; normal mood and affect.    Labs Reviewed  HIV ANTIBODY (ROUTINE TESTING W REFLEX)  RPR  CERVICOVAGINAL ANCILLARY ONLY    No Known Allergies  Past Medical History:  Diagnosis Date   Asthma    Family History  Problem Relation Age of Onset   Healthy Mother    Asthma Mother    Healthy Father    Asthma Father    Asthma Brother    Diabetes Paternal Grandmother    Social History   Socioeconomic History   Marital status: Single    Spouse name: Not on file   Number of children: Not on file   Years of education: Not on file   Highest education level: Not on file  Occupational  History   Not on file  Tobacco Use   Smoking status: Never   Smokeless tobacco: Never  Vaping Use   Vaping status: Never Used  Substance and Sexual Activity   Alcohol use: Yes    Comment: occ   Drug use: Never   Sexual activity: Yes    Partners: Male    Birth control/protection: None, Pill  Other Topics Concern   Not on file  Social History Narrative   Not on file   Social Drivers of Health   Financial Resource Strain: Not on File (07/06/2021)   Received from [employer]    Financial Resource Strain: 0  Food Insecurity: Not on File (12/13/2022)   Received from [facility] Insecurity    Food: 0  Transportation Needs: Not on File (07/06/2021)   Received from [employer] Needs    Transportation: 0  Physical Activity: Not on File (07/06/2021)   Received from [facility]   Physical Activity    Physical Activity: 0  Stress: Not on File (07/06/2021)   Received from [facility]   Stress    Stress: 0  Social Connections: Not on File (12/01/2022)   Received from [facility]   Social Connections    Connectedness: 0  Intimate Partner Violence: Unknown (06/23/2021)   Received from [facility]   HITS    Physically Hurt: Not on file  Insult or Talk Down To: Not on file    Threaten Physical Harm: Not on file    Scream or Curse: Not on file           Rolinda Rogue, MD 01/04/24 [phone]

## 2024-01-04 NOTE — ED Triage Notes (Signed)
 Patient is  here for STI- tesing.Denies any symptoms.

## 2024-01-05 LAB — RPR: RPR Ser Ql: NONREACTIVE

## 2024-01-06 LAB — CERVICOVAGINAL ANCILLARY ONLY
Chlamydia: POSITIVE — AB
Comment: NEGATIVE
Comment: NEGATIVE
Comment: NORMAL
Neisseria Gonorrhea: POSITIVE — AB
Trichomonas: POSITIVE — AB

## 2024-01-07 ENCOUNTER — Ambulatory Visit (HOSPITAL_COMMUNITY): Payer: Self-pay

## 2024-01-07 MED ORDER — DOXYCYCLINE HYCLATE 100 MG PO TABS: 100.0000 mg | ORAL_TABLET | Freq: Two times a day (BID) | ORAL | 0 refills | Status: AC

## 2024-01-07 MED ORDER — METRONIDAZOLE 500 MG PO TABS: 500.0000 mg | ORAL_TABLET | Freq: Two times a day (BID) | ORAL | 0 refills | Status: AC

## 2024-01-31 ENCOUNTER — Telehealth: Admitting: Physician Assistant

## 2024-01-31 DIAGNOSIS — M25532 Pain in left wrist: Secondary | ICD-10-CM | POA: Diagnosis not present

## 2024-01-31 MED ORDER — METHYLPREDNISOLONE 4 MG PO TBPK: ORAL_TABLET | ORAL | 0 refills | Status: DC

## 2024-01-31 NOTE — Patient Instructions (Signed)
 Michaela Gibson, thank you for joining Michaela CHRISTELLA Dickinson, PA-C for today's virtual visit.  While this provider is not your primary care provider (PCP), if your PCP is located in our provider database this encounter information will be shared with them immediately following your visit.   A [facility] MyChart account gives you access to today's visit and all your visits, tests, and labs performed at [facility]  click here if you don't have a [facility] MyChart account or go to [email or web address]  Consent: (Patient) Michaela Gibson provided verbal consent for this virtual visit at the beginning of the encounter.  Current Medications:  Current Outpatient Medications:    methylPREDNISolone  (MEDROL  DOSEPAK) 4 MG TBPK tablet, 6 day taper; take as directed on package instructions, Disp: 21 tablet, Rfl: 0   polyethylene glycol (MIRALAX) 17 g packet, Take 17 g by mouth daily., Disp: 14 each, Rfl: 0   Prenatal MV-Min-FA-Omega-3 (PRENATAL GUMMIES/DHA & FA) 0.4-32.5 MG CHEW, Chew by mouth., Disp: , Rfl:    Medications ordered in this encounter:  Meds ordered this encounter  Medications   methylPREDNISolone  (MEDROL  DOSEPAK) 4 MG TBPK tablet    Sig: 6 day taper; take as directed on package instructions    Dispense:  21 tablet    Refill:  0    Supervising Provider:   BLAISE ALEENE KIDD [[id number]]     *If you need refills on other medications prior to your next appointment, please contact your pharmacy*  Follow-Up: Call back or seek an in-person evaluation if the symptoms worsen or if the condition fails to improve as anticipated.  [facility] [phone]  Other Instructions   Orthopedic Urgent Care Location: [facility] [address], [address] [place], [place] [address] Phone: [phone] Convenient hours: Monday - Friday: 11 a.m. - 7 p.m. Visit our website to schedule an appointment online or walk-in during  clinic hours. Your well-being is our priority. Move better with us !  [facility] [address] [place], [place] [address] CONTACT Phone: [phone] Fax: [phone] URGENT CARE HOURS Monday-Sunday: 9:00AM - 9:00PM  HOLIDAY HOURS Holidays: 8:30AM - 4:30PM  [facility] [address], [place], [place] [address] URGENT CARE HOURS Monday - Friday: 8:00am to 8:00pm Saturday: 10:00am to 3:00pm [phone]  Pinched Nerve in the Wrist (Carpal Tunnel Syndrome): What to Know  Pinched nerve in the wrist (carpal tunnel syndrome, or CTS) is a nerve problem that causes pain, numbness, and weakness in the wrist, hand, and fingers. The carpal tunnel is a narrow space that is on the palm side of your wrist. Repeated wrist motions or certain diseases may cause swelling in the tunnel. This swelling can pinch the main nerve in the wrist (the median nerve). What are the causes? CTS may be caused by: Moving your hand and wrist over and over again while doing a task. Hurting the wrist. Arthritis. A pocket of fluid (cyst) or a growth (tumor) in the carpal tunnel. Fluid buildup when you are pregnant. Use of tools that vibrate. In some cases, the cause of CTS is not known. What increases the risk? You're more likely to have CTS if: You have a job that makes you do these things: Move your hand firmly over and over again. Work with tools that vibrate, such as drills or sanders. You're female. You have diabetes, obesity, thyroid problems, or kidney failure. What are the signs or symptoms? Symptoms of this condition include: A tingling feeling in  your fingers. You may feel this pain in the thumb, index finger, or middle finger. Tingling or loss of feeling in your hand. Pain in your entire arm. This pain may get worse when you bend your wrist and elbow for a long time. Pain in your wrist that goes up your arm to your shoulder. Pain that goes down into  your palm or fingers. Weakness in your hands. You may find it hard to grab and hold items. Your symptoms may feel worse during the night. How is this diagnosed? CTS is diagnosed with a medical history and physical exam. Tests and imaging may also be done to: Check the electrical signals sent by your nerves into the muscles. Check how well electrical signals pass through your nerves. Check possible causes of your CTS. These include X-rays, ultrasound, and MRI. How is this treated? CTS may be treated with: Lifestyle changes. You will be asked to stop or change the activity that caused your problem. Physical therapy. This may include: Exercises that stretch and strengthen the muscles and tendons in the wrist and hand. Nerve gliding or flossing exercises. These help keep nerves moving smoothly through the tissues around them. Occupational therapy. You'll learn how to use your hand again. Medicines for pain and swelling. You may have injections in your wrist. A wrist splint or brace. Surgery. Follow these instructions at home: If you have a splint or brace: Wear the splint or brace as told. Take it off only if your provider says you can. Check the skin around it every day. Tell your provider if you see problems. Loosen the splint or brace if your fingers tingle, are numb, or turn cold and blue. Keep the splint or brace clean and dry. If the splint or brace isn't waterproof: Do not let it get wet. Cover it when you take a bath or shower. Use a cover that doesn't let any water in. Managing pain, stiffness, and swelling  Use ice or an ice pack as told. If you have a splint or brace that you can take off, remove it only as told. Place a towel between your skin and the ice. Leave the ice on for 20 minutes, 2-3 times a day. If your skin turns red, take off the ice right away to prevent skin damage. The risk of damage is higher if you can't feel pain, heat, or cold. Move your fingers often to  reduce stiffness and swelling. General instructions Take your medicines only as told. Rest your wrist and hand from activity that may cause pain. If your CTS is caused by things you do at work, talk with your employer about making changes. For example, you may need a wrist pad to use while typing. Exercise as told. Follow instructions on how to do nerve gliding or flossing exercises. These help keep nerves in moving smoothly through the tissues around them. Keep all follow-up visits. This is important. Where to find more information American Academy of Orthopedic Surgeons: orthoinfo.aaos.[employer] of Neurological Disorders and Stroke: basicfm.no Contact a health care provider if: You have new symptoms. Your pain is not controlled with medicines. Your symptoms get worse. Get help right away if: Your hand or wrist tingles or is numb, and the symptoms become very bad. This information is not intended to replace advice given to you by your health care provider. Make sure you discuss any questions you have with your health care provider. Document Revised: 01/15/2023 Document Reviewed: 11/02/2022 Elsevier Patient Education  2024 Elsevier  Inc.   If you have been instructed to have an in-person evaluation today at a local Urgent Care facility, please use the link below. It will take you to a list of all of our available [facility], including address, phone number and hours of operation. Please do not delay care.  [facility]  If you or a family member do not have a primary care provider, use the link below to schedule a visit and establish care. When you choose a [facility] primary care physician or advanced practice provider, you gain a long-term partner in health. Find a Primary Care Provider  Learn more about [facility]'s in-office and virtual care options:  - Get Care Now
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
## 2024-04-09 ENCOUNTER — Encounter: Payer: Self-pay | Admitting: Physician Assistant

## 2024-04-10 ENCOUNTER — Telehealth: Admitting: Family Medicine

## 2024-04-10 NOTE — Progress Notes (Signed)
 Pt did not show for visit [facility]

## 2024-04-11 ENCOUNTER — Telehealth: Admitting: Family

## 2024-04-11 DIAGNOSIS — N920 Excessive and frequent menstruation with regular cycle: Secondary | ICD-10-CM

## 2024-04-11 DIAGNOSIS — N946 Dysmenorrhea, unspecified: Secondary | ICD-10-CM

## 2024-04-11 DIAGNOSIS — Z975 Presence of (intrauterine) contraceptive device: Secondary | ICD-10-CM | POA: Diagnosis not present

## 2024-04-11 MED ORDER — IBUPROFEN 600 MG PO TABS: 600.0000 mg | ORAL_TABLET | Freq: Four times a day (QID) | ORAL | 0 refills | Status: AC | PRN

## 2024-04-11 NOTE — Patient Instructions (Signed)
 Cramps During Your Menstrual Period Cramps during your period is called dysmenorrhea. The cramps cause pain in your lower belly. In some cases, the pain may not be that bad. In other cases, the pain may be so bad that it can affect your daily life for a few days each month. There're two types of this condition: Primary. This happens when a female has their first monthly period or soon after. As a person gets older or has a baby, the cramps usually lessen or go away. Secondary. This type begins later in life. It's caused by a problem in the reproductive system. This type lasts longer. The pain may also be worse than in primary type. What are the causes? Primary dysmenorrhea is caused by hormone changes during your period.  Common causes of secondary dysmenorrhea include: Problems with the tissue that lines the uterus. This tissue may: Grow outside the uterus. Grow into the walls of the uterus. Growths in the uterus that are not cancer (fibroids). Problems with the reproductive organs. These may include problems with the uterus, fallopian tubes, or ovaries. Infection. What increases the risk? You are more likely to get this condition if: You are younger than 26 years old. You had your first period before you were 26 years old. You have irregular or heavy bleeding. You have never given birth. You have family members with the condition. You smoke or use nicotine products. You have high body weight or a low body weight. What are the signs or symptoms? Symptoms of this condition may include: Cramps and pain in the lower belly or lower back. Headaches. Throwing up or feeling like you may throw up. Watery poop (diarrhea) or loose poop. Feeling dizzy. Feeling tired. How is this diagnosed? This condition may be diagnosed based on your symptoms, medical history, and a physical exam. You may also have other tests, including: Imaging tests, such as ultrasound, X-ray, CT scan, or MRI. Blood  tests. A pregnancy test. A pap test. Taking out and testing a small piece of the tissue that lines the uterus. A procedure that uses a device with a camera to look inside the uterus, pelvis, or bladder. How is this treated? Treatment depends on the cause of your condition. You may be given: Medicines for pain. Hormones. These include shots to stop your periods. You may also get hormones through birth control pills or an IUD. Antidepressant medicines. Antibiotics. These are given if your cramps are being caused by an infection. Other treatment may include: Exercise and physical therapy. Meditation, yoga, and acupuncture. Stimulating the nerves that go to the bottom of the spine. If other treatments don't work, your health care provider may suggest having surgery. This is rare. Work with your provider to see which treatment is best for you. Follow these instructions at home: Relieving pain and cramping  Use heat as told. Put the heat on your lower back or belly when you have pain or cramps. Use the heat source that your provider recommends, such as a moist heat pack or a heating pad. Do this as often as told. Place a towel between your skin and the heat source. Leave the heat on for 20-30 minutes. If your skin turns red, take off the heat right away to prevent burns. The risk of burns is higher if you can't feel pain, heat, or cold. Do not sleep with a heating pad on. Exercise as told. Activities such as walking, swimming, or biking can help to relieve pain. Massage your lower back  or belly to help relieve pain. General instructions Take your medicines only as told. Ask your provider if it's safe to drive or use machines while taking your medicine. Avoid alcohol and caffeine during and right before your period. These can make cramps worse. Do not smoke, vape, or use nicotine or tobacco. Keep all follow-up visits. Your provider can change your treatment plan if treatment isn't  working. Contact a health care provider if: You have symptoms that get worse or do not get better with medicine. You have new symptoms. This information is not intended to replace advice given to you by your health care provider. Make sure you discuss any questions you have with your health care provider. Document Revised: 01/15/2023 Document Reviewed: 08/28/2022 Elsevier Patient Education  2024 [employer].
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
